# Patient Record
Sex: Female | Born: 2000 | Race: White | Hispanic: No | Marital: Single | Smoking: Never smoker
Health system: Southern US, Community
[De-identification: ages and names within clinical notes are randomized; demographics above are authoritative.]

## PROBLEM LIST (undated history)

## (undated) DIAGNOSIS — J45909 Unspecified asthma, uncomplicated: Secondary | ICD-10-CM

## (undated) DIAGNOSIS — F32A Depression, unspecified: Secondary | ICD-10-CM

## (undated) DIAGNOSIS — F419 Anxiety disorder, unspecified: Secondary | ICD-10-CM

## (undated) HISTORY — DX: Depression, unspecified: F32.A

## (undated) HISTORY — DX: Anxiety disorder, unspecified: F41.9

## (undated) HISTORY — DX: Unspecified asthma, uncomplicated: J45.909

---
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
## 2022-05-10 ENCOUNTER — Other Ambulatory Visit: Payer: Self-pay

## 2022-05-10 ENCOUNTER — Encounter (HOSPITAL_COMMUNITY): Payer: Self-pay | Admitting: Emergency Medicine

## 2022-05-10 ENCOUNTER — Emergency Department (HOSPITAL_COMMUNITY)
Admission: EM | Admit: 2022-05-10 | Discharge: 2022-05-10 | Discharge status: Against Medical Advice | Payer: Medicaid Other | Attending: Emergency Medicine | Admitting: Emergency Medicine

## 2022-05-10 DIAGNOSIS — Z5321 Procedure and treatment not carried out due to patient leaving prior to being seen by health care provider: Secondary | ICD-10-CM | POA: Diagnosis not present

## 2022-05-10 DIAGNOSIS — M79601 Pain in right arm: Secondary | ICD-10-CM | POA: Insufficient documentation

## 2022-05-10 DIAGNOSIS — Y92410 Unspecified street and highway as the place of occurrence of the external cause: Secondary | ICD-10-CM | POA: Diagnosis not present

## 2022-05-10 DIAGNOSIS — M25571 Pain in right ankle and joints of right foot: Secondary | ICD-10-CM | POA: Insufficient documentation
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
## 2022-05-10 NOTE — ED Notes (Signed)
Pt left with visitors due to significant other being transported to [place] [facility] via [facility]. Pt thought he had been transported here. Pt seen walking out with visitors.

## 2022-05-11 ENCOUNTER — Emergency Department: Payer: Medicaid Other

## 2022-05-11 ENCOUNTER — Emergency Department
Admission: EM | Admit: 2022-05-11 | Discharge: 2022-05-11 | Disposition: A | Discharge status: Home / Self Care | Payer: Medicaid Other | Attending: Emergency Medicine | Admitting: Emergency Medicine

## 2022-05-11 ENCOUNTER — Other Ambulatory Visit: Payer: Self-pay

## 2022-05-11 DIAGNOSIS — R0789 Other chest pain: Secondary | ICD-10-CM | POA: Insufficient documentation

## 2022-05-11 DIAGNOSIS — M79601 Pain in right arm: Secondary | ICD-10-CM | POA: Diagnosis present

## 2022-05-11 DIAGNOSIS — Z789 Other specified health status: Secondary | ICD-10-CM

## 2022-05-11 LAB — CBC
HCT: 43.8 % (ref 36.0–46.0)
Hemoglobin: 14.3 g/dL (ref 12.0–15.0)
MCH: 27.3 pg (ref 26.0–34.0)
MCHC: 32.6 g/dL (ref 30.0–36.0)
MCV: 83.7 fL (ref 80.0–100.0)
Platelets: 285 10*3/uL (ref 150–400)
RBC: 5.23 MIL/uL — ABNORMAL HIGH (ref 3.87–5.11)
RDW: 12.3 % (ref 11.5–15.5)
WBC: 10.8 10*3/uL — ABNORMAL HIGH (ref 4.0–10.5)
nRBC: 0 % (ref 0.0–0.2)

## 2022-05-11 LAB — COMPREHENSIVE METABOLIC PANEL
ALT: 12 U/L (ref 0–44)
AST: 20 U/L (ref 15–41)
Albumin: 4.9 g/dL (ref 3.5–5.0)
Alkaline Phosphatase: 52 U/L (ref 38–126)
Anion gap: 7 (ref 5–15)
BUN: 15 mg/dL (ref 6–20)
CO2: 24 mmol/L (ref 22–32)
Calcium: 9.5 mg/dL (ref 8.9–10.3)
Chloride: 103 mmol/L (ref 98–111)
Creatinine, Ser: 0.74 mg/dL (ref 0.44–1.00)
GFR, Estimated: >60 mL/min (ref >60)
Glucose, Bld: 98 mg/dL (ref 70–99)
Potassium: 3.5 mmol/L (ref 3.5–5.1)
Sodium: 134 mmol/L — ABNORMAL LOW (ref 135–145)
Total Bilirubin: 0.7 mg/dL (ref 0.3–1.2)
Total Protein: 8.8 g/dL — ABNORMAL HIGH (ref 6.5–8.1)

## 2022-05-11 LAB — POC URINE PREG, ED: Preg Test, Ur: NEGATIVE

## 2022-05-11 MED ORDER — IOHEXOL 300 MG/ML  SOLN
80.0000 mL | Freq: Once | INTRAMUSCULAR | Status: AC | PRN
Start: 2022-05-11 — End: 2022-05-11
  Administered 2022-05-11: 100 mL via INTRAVENOUS
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
## 2022-05-11 NOTE — ED Notes (Signed)
Pt in xray

## 2022-05-11 NOTE — ED Notes (Signed)
Patient transported to CT 

## 2022-05-11 NOTE — Discharge Instructions (Signed)
Please take Tylenol and ibuprofen/Advil for your pain.  It is safe to take them together, or to alternate them every few hours.  Take up to 1000mg of Tylenol at a time, up to 4 times per day.  Do not take more than 4000 mg of Tylenol in 24 hours.  For ibuprofen, take 400-600 mg, 3 - 4 times per day.  

## 2022-05-12 ENCOUNTER — Ambulatory Visit: Payer: Medicaid Other | Admitting: Dermatology

## 2022-09-03 ENCOUNTER — Encounter: Payer: Self-pay | Admitting: Emergency Medicine

## 2022-09-03 ENCOUNTER — Emergency Department: Payer: Medicaid Other

## 2022-09-03 ENCOUNTER — Other Ambulatory Visit: Payer: Self-pay

## 2022-09-03 DIAGNOSIS — J101 Influenza due to other identified influenza virus with other respiratory manifestations: Secondary | ICD-10-CM | POA: Diagnosis not present

## 2022-09-03 DIAGNOSIS — Z20822 Contact with and (suspected) exposure to covid-19: Secondary | ICD-10-CM | POA: Insufficient documentation

## 2022-09-03 DIAGNOSIS — R059 Cough, unspecified: Secondary | ICD-10-CM | POA: Diagnosis present

## 2022-09-03 NOTE — ED Triage Notes (Signed)
Patient ambulatory to triage with steady gait, without difficulty or distress noted; pt reports several days of fever, prod cough yellow sputum; home covid test neg

## 2022-09-04 ENCOUNTER — Emergency Department
Admission: EM | Admit: 2022-09-04 | Discharge: 2022-09-04 | Disposition: A | Discharge status: Home / Self Care | Payer: Medicaid Other | Attending: Emergency Medicine | Admitting: Emergency Medicine

## 2022-09-04 DIAGNOSIS — J101 Influenza due to other identified influenza virus with other respiratory manifestations: Secondary | ICD-10-CM

## 2022-09-04 LAB — RESP PANEL BY RT-PCR (RSV, FLU A&B, COVID)  RVPGX2
Influenza A by PCR: POSITIVE — AB
Influenza B by PCR: NEGATIVE
Resp Syncytial Virus by PCR: NEGATIVE
SARS Coronavirus 2 by RT PCR: NEGATIVE

## 2022-09-04 MED ORDER — IBUPROFEN 800 MG PO TABS
800.0000 mg | ORAL_TABLET | Freq: Once | ORAL | Status: AC
  Administered 2022-09-04: 800 mg via ORAL
  Filled 2022-09-04: qty 1

## 2022-09-04 MED ORDER — BENZONATATE 100 MG PO CAPS
100.0000 mg | ORAL_CAPSULE | Freq: Once | ORAL | Status: AC
  Administered 2022-09-04: 100 mg via ORAL
  Filled 2022-09-04: qty 1

## 2022-09-04 MED ORDER — BENZONATATE 100 MG PO CAPS: 100.0000 mg | ORAL_CAPSULE | Freq: Three times a day (TID) | ORAL | 0 refills | Status: AC | PRN

## 2022-09-04 NOTE — Discharge Instructions (Addendum)
You may alternate Tylenol 1000 mg every 6 hours as needed for fever and pain and ibuprofen 800 mg every 6-8 hours as needed for fever and pain. Please rest and drink plenty of fluids. This is a viral illness causing your symptoms. You do not need antibiotics for a virus. You may use over-the-counter nasal saline spray and Afrin nasal saline spray as needed for nasal congestion. Please do not use Afrin for more than 3 days in a row. You may use guaifenesin and dextromethorphan as needed for cough.  You may use lozenges and Chloraseptic spray to help with sore throat.  Warm salt water gargles can also help with sore throat.  You may use over-the-counter Unisom (doxyalamine) or Benadryl (diphenhydramine) to help with sleep.  Please note that some combination medicines such as DayQuil and NyQuil have multiple medications in them.  Please make sure you look at all labels to ensure that you are not taking too much of any one particular medication.  Symptoms from a virus may take 7-14 days to run its course.  The flu is treated like any other virus with supportive measures as listed above. At this time you are outside the treatment window for Tamiflu or Xofluza. These medications have to be taken within the first 48 hours of symptoms.  Tamiflu has many side effects including nausea, vomiting and diarrhea.  

## 2022-09-04 NOTE — ED Provider Notes (Signed)
[facility] Provider Note    Event Date/Time   First MD Initiated Contact with Patient 09/04/22 [phone]     (approximate)   History   Cough   HPI  Alexandra Larson is a 21 y.o. female with no significant past medical history who presents to the emergency department with cough, congestion, fever, body aches for the past 5 to 6 days.  No vomiting or diarrhea.   History provided by patient and significant other.    History reviewed. No pertinent past medical history.  History reviewed. No pertinent surgical history.  MEDICATIONS:  Prior to Admission medications   Medication Sig Start Date End Date Taking? Authorizing Provider  benzonatate (TESSALON PERLES) 100 MG capsule Take 1 capsule (100 mg total) by mouth 3 (three) times daily as needed for cough. 09/04/22 09/04/23 Yes Quincie Haroon, Layla Maw, DO  etonogestrel (NEXPLANON) 68 MG IMPL implant 1 each by Subdermal route once.    [provider]  ibuprofen (ADVIL) 600 MG tablet Take 1 tablet (600 mg total) by mouth every 8 (eight) hours as needed for moderate pain. 08/07/21   Orvil Feil, PA-C    Physical Exam   Triage Vital Signs: ED Triage Vitals  Enc Vitals Group     BP 09/03/22 2357 134/77     Pulse Rate 09/03/22 2357 81     Resp 09/03/22 2357 17     Temp 09/03/22 2357 98.4 F (36.9 C)     Temp Source 09/03/22 2357 Oral     SpO2 09/03/22 2357 98 %     Weight 09/03/22 2351 90 lb (40.8 kg)     Height 09/03/22 2351 5\' 2"  (1.575 m)     Head Circumference --      Peak Flow --      Pain Score 09/03/22 2351 0     Pain Loc --      Pain Edu? --      Excl. in GC? --     Most recent vital signs: Vitals:   09/03/22 2357 09/04/22 0435  BP: 134/77 129/72  Pulse: 81 75  Resp: 17 16  Temp: 98.4 F (36.9 C)   SpO2: 98% 99%    CONSTITUTIONAL: Alert and oriented and responds appropriately to questions. Well-appearing; well-nourished HEAD: Normocephalic, atraumatic EYES: Conjunctivae clear,  pupils appear equal, sclera nonicteric ENT: normal nose; moist mucous membranes NECK: Supple, normal ROM CARD: RRR; S1 and S2 appreciated; no murmurs, no clicks, no rubs, no gallops RESP: Normal chest excursion without splinting or tachypnea; breath sounds clear and equal bilaterally; no wheezes, no rhonchi, no rales, no hypoxia or respiratory distress, speaking full sentences ABD/GI: Normal bowel sounds; non-distended; soft, non-tender, no rebound, no guarding, no peritoneal signs BACK: The back appears normal EXT: Normal ROM in all joints; no deformity noted, no edema; no cyanosis SKIN: Normal color for age and race; warm; no rash on exposed skin NEURO: Moves all extremities equally, normal speech PSYCH: The patient's mood and manner are appropriate.   ED Results / Procedures / Treatments   LABS: (all labs ordered are listed, but only abnormal results are displayed) Labs Reviewed  RESP PANEL BY RT-PCR (RSV, FLU A&B, COVID)  [id number] - Abnormal; Notable for the following components:      Result Value   Influenza A by PCR POSITIVE (*)    All other components within normal limits     EKG:  RADIOLOGY: My personal review and interpretation of imaging: Chest x-ray clear.  I have personally reviewed all radiology reports.   DG Chest 2 View  Result Date: 09/04/2022 CLINICAL DATA:  Cough EXAM: CHEST - 2 VIEW COMPARISON:  02/12/2018 FINDINGS: The heart size and mediastinal contours are within normal limits. Both lungs are clear. The visualized skeletal structures are unremarkable. IMPRESSION: No active cardiopulmonary disease. Electronically Signed   By: Helyn Numbers M.D.   On: 09/04/2022 00:09     PROCEDURES:  Critical Care performed: No     Procedures    IMPRESSION / MDM / ASSESSMENT AND PLAN / ED COURSE  I reviewed the triage vital signs and the nursing notes.    Patient here with flulike symptoms.    DIFFERENTIAL DIAGNOSIS (includes but not limited to):   Viral  URI, pneumonia, doubt sepsis, meningitis   Patient's presentation is most consistent with acute complicated illness / injury requiring diagnostic workup.   PLAN: COVID, flu and RSV swabs obtained from triage.  Patient is positive for influenza A.  Chest x-ray reviewed and interpreted by myself and the radiologist and shows no acute abnormality.  Patient well-appearing.  Will give ibuprofen, Tessalon Perles for symptomatic relief.  Will discharge with prescription for Tessalon Perles.  Discussed supportive care instructions and return precautions.  She is outside treatment window for antiviral medications.   MEDICATIONS GIVEN IN ED: Medications  ibuprofen (ADVIL) tablet 800 mg (800 mg Oral Given 09/04/22 0434)  benzonatate (TESSALON) capsule 100 mg (100 mg Oral Given 09/04/22 0434)     ED COURSE:  At this time, I do not feel there is any life-threatening condition present. I reviewed all nursing notes, vitals, pertinent previous records.  All lab and urine results, EKGs, imaging ordered have been independently reviewed and interpreted by myself.  I reviewed all available radiology reports from any imaging ordered this visit.  Based on my assessment, I feel the patient is safe to be discharged home without further emergent workup and can continue workup as an outpatient as needed. Discussed all findings, treatment plan as well as usual and customary return precautions.  They verbalize understanding and are comfortable with this plan.  Outpatient follow-up has been provided as needed.  All questions have been answered.    CONSULTS:  none   OUTSIDE RECORDS REVIEWED: Reviewed last internal medicine note with Marvis Moeller on 06/06/2020.       FINAL CLINICAL IMPRESSION(S) / ED DIAGNOSES   Final diagnoses:  Influenza A     Rx / DC Orders   ED Discharge Orders          Ordered    benzonatate (TESSALON PERLES) 100 MG capsule  3 times daily PRN        09/04/22 0420              Note:  This document was prepared using Dragon voice recognition software and may include unintentional dictation errors.   Iolanda Folson, Layla Maw, DO 09/04/22 [phone]

## 2023-02-03 IMAGING — US US RENAL
1 series · 14 of 25 positions shown · non-contrast
Comparison: None.

CLINICAL DATA: Hematuria.

EXAM:
RENAL / URINARY TRACT ULTRASOUND COMPLETE

[Series 1: us renal · 14 of 57 slices shown]
[im 1/57]
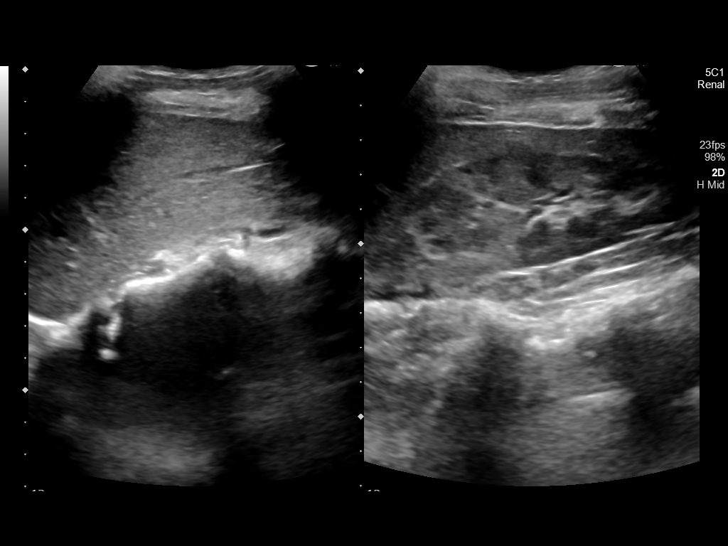
[im 5/57]
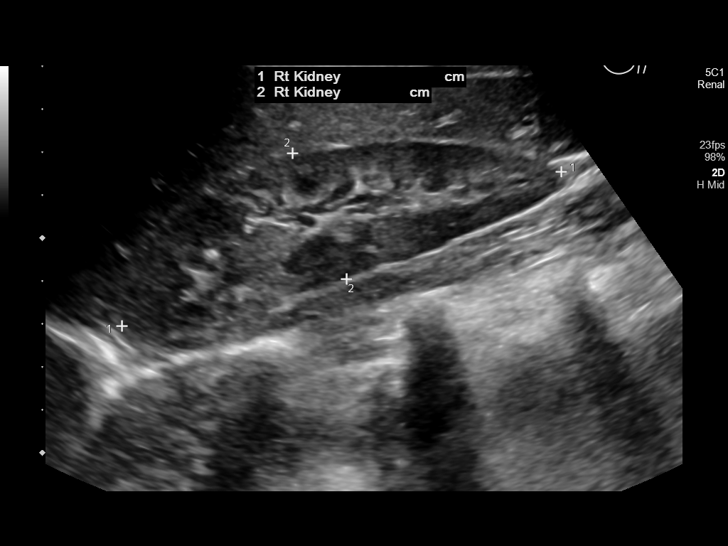
[im 10/57]
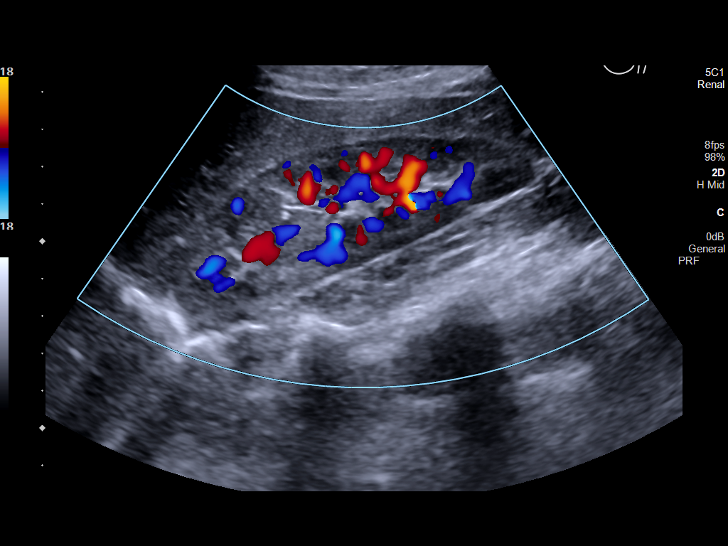
[im 15/57]
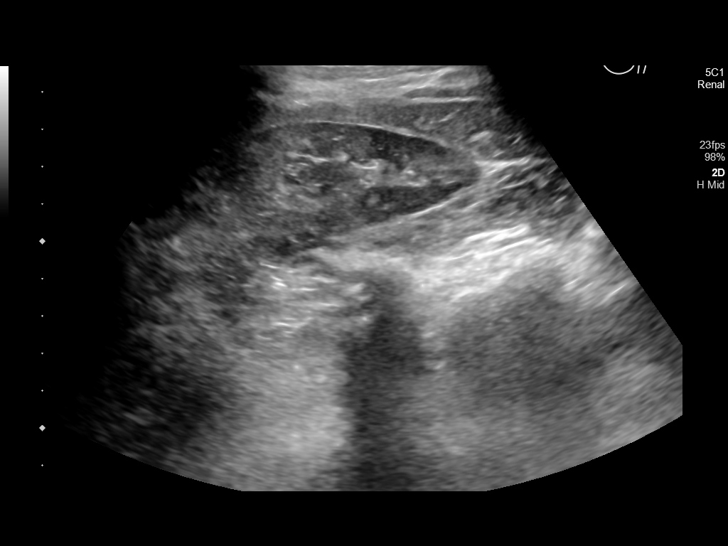
[im 19/57]
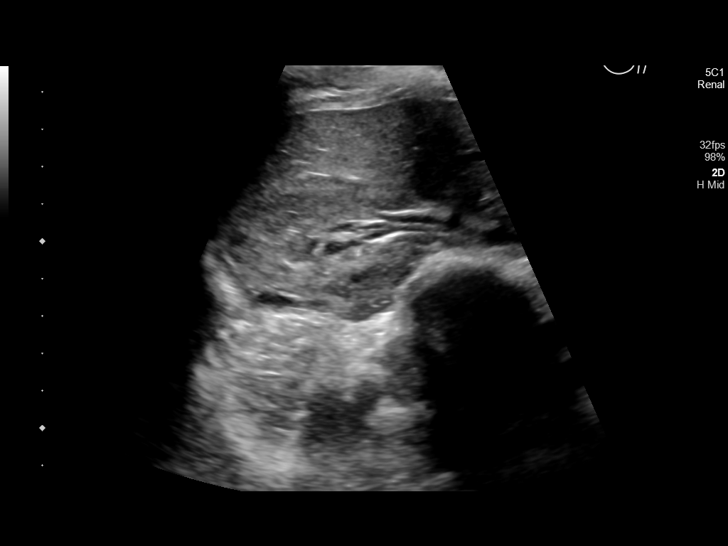
[im 22/57]
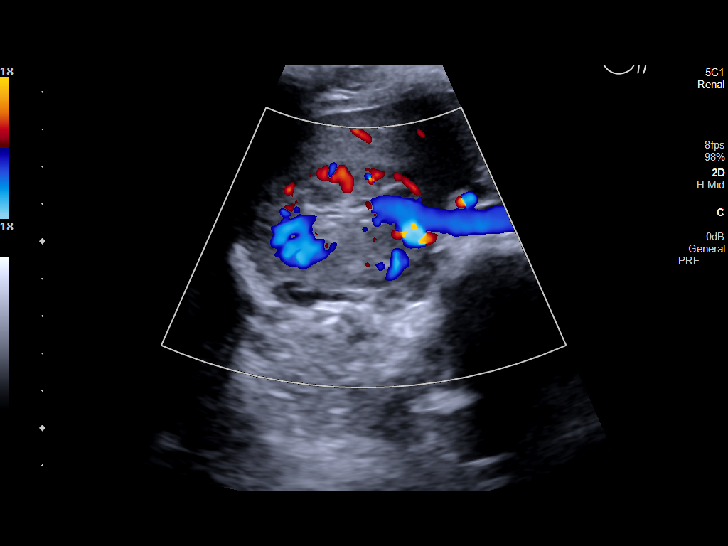
[im 26/57]
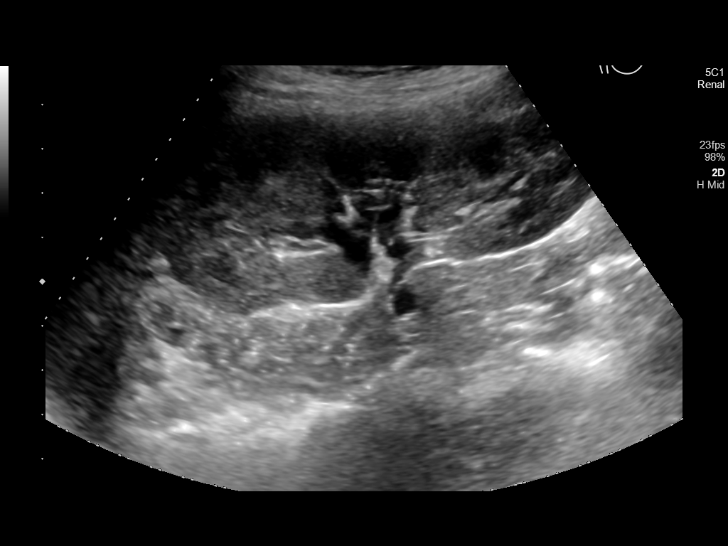
[im 31/57]
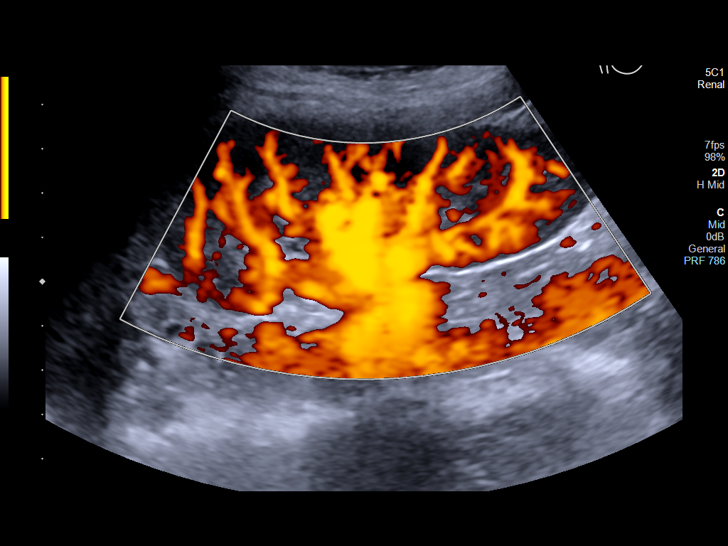
[im 36/57]
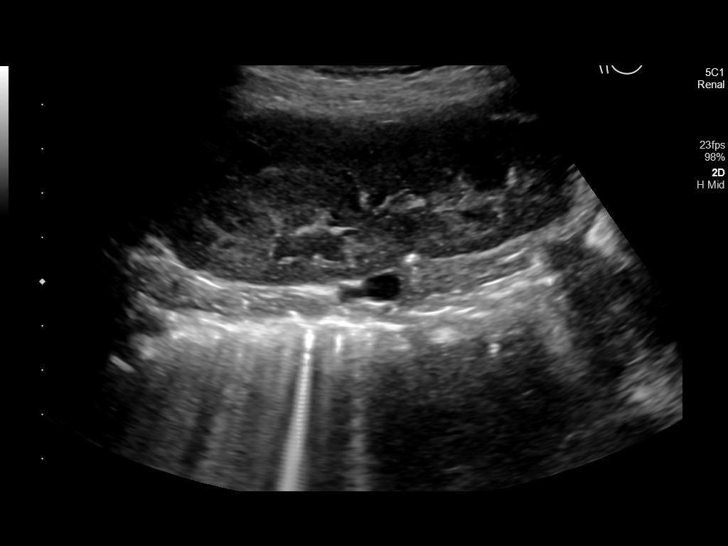
[im 38/57]
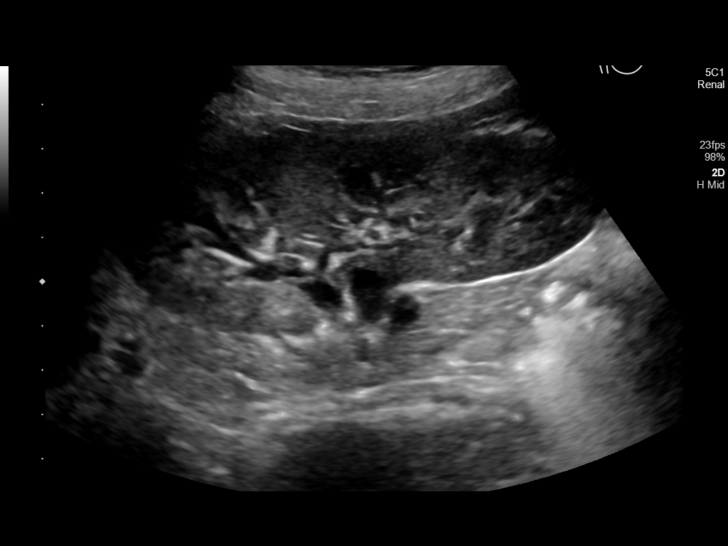
[im 43/57]
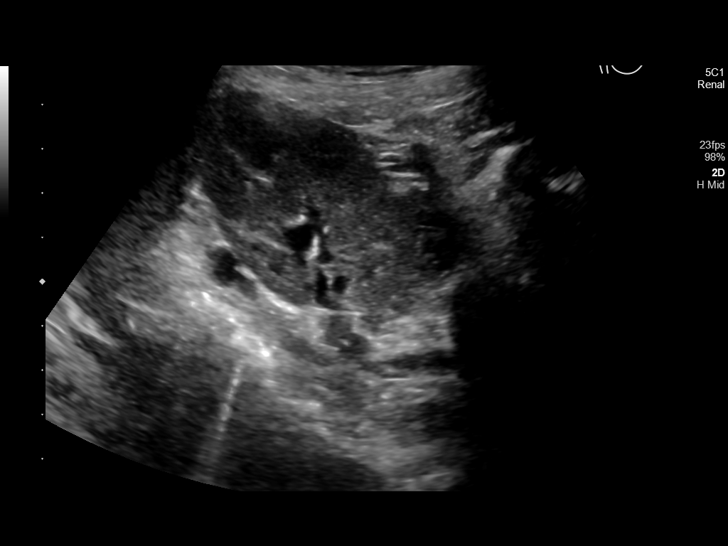
[im 47/57]
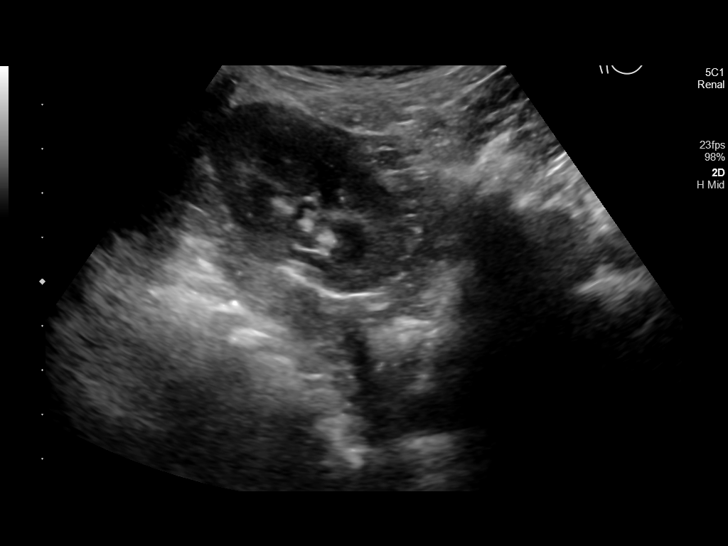
[im 52/57]
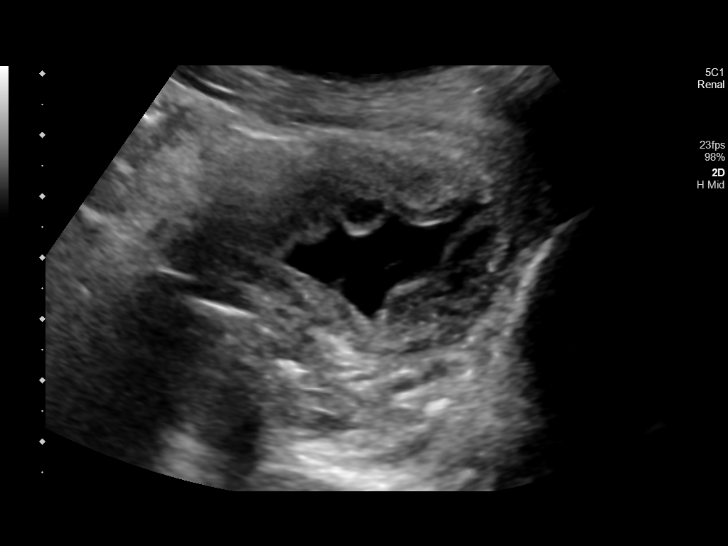
[im 57/57]
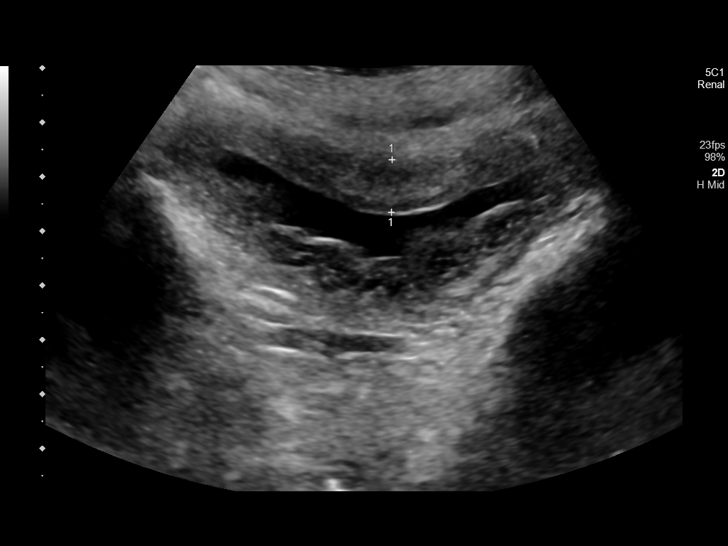

[14 of 25 positions shown; findings below may reference images not displayed]

FINDINGS: Right Kidney:

Renal measurements: 10.8 x 3.2 x 4.4 cm = volume: 79 mL.
Echogenicity within normal limits. No mass or hydronephrosis
visualized.

Left Kidney:

Renal measurements: 10.8 x 4.0 x 3.5 cm = volume: 80 mL.
Echogenicity within normal limits. No mass or hydronephrosis
visualized.

Bladder:

Bladder is mostly decompressed. Despite being decompressed, wall
still appears thickened. No convincing bladder mass or stone.

Other:

None.
IMPRESSION: 1. Normal kidneys.
2. Bladder wall appears thickened, with bladder assessment limited
by lack of distension. Findings support cystitis in the proper
clinical setting.

## 2023-04-19 ENCOUNTER — Other Ambulatory Visit: Payer: Self-pay

## 2023-04-19 ENCOUNTER — Emergency Department: Payer: Medicaid Other

## 2023-04-19 ENCOUNTER — Emergency Department: Admission: EM | Admit: 2023-04-19 | Payer: Medicaid Other | Source: Home / Self Care

## 2023-04-19 DIAGNOSIS — R079 Chest pain, unspecified: Secondary | ICD-10-CM | POA: Diagnosis present

## 2023-04-19 DIAGNOSIS — R1010 Upper abdominal pain, unspecified: Secondary | ICD-10-CM | POA: Insufficient documentation

## 2023-04-19 DIAGNOSIS — R0789 Other chest pain: Secondary | ICD-10-CM | POA: Diagnosis not present

## 2023-04-19 MED ORDER — PANTOPRAZOLE SODIUM 40 MG PO TBEC
40.0000 mg | DELAYED_RELEASE_TABLET | Freq: Once | ORAL | Status: AC
  Administered 2023-04-20: 40 mg via ORAL
  Filled 2023-04-19: qty 1

## 2023-04-19 MED ORDER — ALUM & MAG HYDROXIDE-SIMETH 200-200-20 MG/5ML PO SUSP
30.0000 mL | Freq: Once | ORAL | Status: AC
  Administered 2023-04-20: 30 mL via ORAL
  Filled 2023-04-19: qty 30

## 2023-04-19 NOTE — ED Provider Notes (Signed)
[facility] Provider Note    Event Date/Time   First MD Initiated Contact with Patient 04/19/23 2312     (approximate)   History   Chest Pain and Back Pain   HPI  Alexandra Larson is a 22 y.o. female with no significant past medical history who presents to the emergency department with complaints of lower chest and upper abdominal pain ongoing for several days.  Symptoms worse after eating.  No alleviating factors.  States it feels like a pressure and sometimes she does feel short of breath with it.  No nausea, vomiting, diarrhea, bloody stools, melena, dysuria or hematuria.  Still has her gallbladder.  No history of gastric ulcers.  No history of PE, DVT, exogenous estrogen use, recent fractures, surgery, trauma, hospitalization, prolonged travel or other immobilization. No lower extremity swelling or pain. No calf tenderness.  History provided by patient, significant other.    History reviewed. No pertinent past medical history.  History reviewed. No pertinent surgical history.  MEDICATIONS:  Prior to Admission medications   Medication Sig Start Date End Date Taking? Authorizing Provider  benzonatate (TESSALON PERLES) 100 MG capsule Take 1 capsule (100 mg total) by mouth 3 (three) times daily as needed for cough. 09/04/22 09/04/23  Antario Yasuda, Layla Maw, DO  etonogestrel (NEXPLANON) 68 MG IMPL implant 1 each by Subdermal route once.    [provider]  ibuprofen (ADVIL) 600 MG tablet Take 1 tablet (600 mg total) by mouth every 8 (eight) hours as needed for moderate pain. 08/07/21   Orvil Feil, PA-C    Physical Exam   Triage Vital Signs: ED Triage Vitals  Encounter Vitals Group     BP 04/19/23 2306 110/79     Systolic BP Percentile --      Diastolic BP Percentile --      Pulse Rate 04/19/23 2306 66     Resp 04/19/23 2306 19     Temp 04/19/23 2306 98.3 F (36.8 C)     Temp Source 04/19/23 2306 Oral     SpO2 04/19/23 2306 100 %     Weight  04/19/23 2307 90 lb (40.8 kg)     Height 04/19/23 2307 5\' 2"  (1.575 m)     Head Circumference --      Peak Flow --      Pain Score 04/19/23 2307 8     Pain Loc --      Pain Education --      Exclude from Growth Chart --     Most recent vital signs: Vitals:   04/19/23 2306  BP: 110/79  Pulse: 66  Resp: 19  Temp: 98.3 F (36.8 C)  SpO2: 100%    CONSTITUTIONAL: Alert, responds appropriately to questions. Well-appearing; well-nourished HEAD: Normocephalic, atraumatic EYES: Conjunctivae clear, pupils appear equal, sclera nonicteric ENT: normal nose; moist mucous membranes NECK: Supple, normal ROM CARD: RRR; S1 and S2 appreciated RESP: Normal chest excursion without splinting or tachypnea; breath sounds clear and equal bilaterally; no wheezes, no rhonchi, no rales, no hypoxia or respiratory distress, speaking full sentences ABD/GI: Non-distended; soft, tender in the upper abdomen without guarding, rebound, negative Murphy sign BACK: The back appears normal EXT: Normal ROM in all joints; no deformity noted, no edema, no calf tenderness or calf swelling SKIN: Normal color for age and race; warm; no rash on exposed skin NEURO: Moves all extremities equally, normal speech PSYCH: The patient's mood and manner are appropriate.   ED Results / Procedures /  Treatments   LABS: (all labs ordered are listed, but only abnormal results are displayed) Labs Reviewed  COMPREHENSIVE METABOLIC PANEL - Abnormal; Notable for the following components:      Result Value   Sodium 133 (*)    Potassium 3.2 (*)    Glucose, Bld 106 (*)    All other components within normal limits  CBC  LIPASE, BLOOD  POC URINE PREG, ED  TROPONIN I (HIGH SENSITIVITY)     EKG:  EKG Interpretation Date/Time:  Monday April 19 2023 23:10:48 EDT Ventricular Rate:  60 PR Interval:  128 QRS Duration:  82 QT Interval:  392 QTC Calculation: 392 R Axis:   77  Text Interpretation: Normal sinus rhythm Normal ECG  When compared with ECG of 12-Feb-2018 13:31, PREVIOUS ECG IS PRESENT Confirmed by Rochele Raring [phone]) on 04/19/2023 11:12:59 PM         RADIOLOGY: My personal review and interpretation of imaging: Chest x-ray clear.  Ultrasound shows no acute abnormality.  I have personally reviewed all radiology reports.   US ABDOMEN LIMITED RUQ (LIVER/GB)  Result Date: 04/20/2023 CLINICAL DATA:  Upper abdominal pain x2 days EXAM: ULTRASOUND ABDOMEN LIMITED RIGHT UPPER QUADRANT COMPARISON:  None Available. FINDINGS: Gallbladder: Contracted gallbladder. No gallstones, gallbladder wall thickening, or pericholecystic fluid. Negative sonographic Murphy's sign. Common bile duct: Diameter: 5 mm Liver: Within the upper limits of normal for parenchymal echogenicity. No focal hepatic lesion is seen. Portal vein is patent on color Doppler imaging with normal direction of blood flow towards the liver. Other: None. IMPRESSION: Negative right upper quadrant ultrasound. Electronically Signed   By: Charline Bills M.D.   On: 04/20/2023 00:49   DG Chest 2 View  Result Date: 04/19/2023 CLINICAL DATA:  Chest pain EXAM: CHEST - 2 VIEW COMPARISON:  09/03/2022 FINDINGS: Lungs are clear.  No pleural effusion or pneumothorax. The heart is normal in size. Visualized osseous structures are within normal limits. IMPRESSION: Normal chest radiographs. Electronically Signed   By: Charline Bills M.D.   On: 04/19/2023 23:40     PROCEDURES:  Critical Care performed: No    .1-3 Lead EKG Interpretation  Performed by: Jermani Eberlein, Layla Maw, DO Authorized by: Yona Stansbury, Layla Maw, DO     Interpretation: normal     ECG rate:  66   ECG rate assessment: normal     Rhythm: sinus rhythm     Ectopy: none     Conduction: normal       IMPRESSION / MDM / ASSESSMENT AND PLAN / ED COURSE  I reviewed the triage vital signs and the nursing notes.    Patient here with lower chest, upper abdominal pain worse after eating.  The patient is on  the cardiac monitor to evaluate for evidence of arrhythmia and/or significant heart rate changes.   DIFFERENTIAL DIAGNOSIS (includes but not limited to):   Gastritis, H. pylori, peptic ulcer, duodenitis, cholelithiasis, less likely cholecystitis or cholangitis, no symptoms of GI bleed.  Low suspicion for ACS.  Doubt PE as she is PERC negative.  Doubt dissection.   Patient's presentation is most consistent with acute illness / injury with system symptoms.   PLAN: Will obtain abdominal labs, chest x-ray, right upper quadrant ultrasound.  EKG is nonischemic.  Will give Mylanta, Protonix for symptomatic relief and reassess.   MEDICATIONS GIVEN IN ED: Medications  sucralfate (CARAFATE) tablet 1 g (has no administration in time range)  lidocaine (XYLOCAINE) 2 % viscous mouth solution 15 mL (has no administration in time  range)  pantoprazole (PROTONIX) EC tablet 40 mg (40 mg Oral Given 04/20/23 0003)  alum & mag hydroxide-simeth (MAALOX/MYLANTA) 200-200-20 MG/5ML suspension 30 mL (30 mLs Oral Given 04/20/23 0003)     ED COURSE: Labs show no leukocytosis, normal hemoglobin, normal LFTs and lipase.  Troponin negative.  I do not feel she needs serial enzymes given low risk for ACS and symptoms ongoing for several days.  Chest x-ray and right upper quadrant ultrasound reviewed and interpreted by myself and the radiologist and are unremarkable.  Discussed with patient that this is likely GI in nature given symptoms change with eating.  Recommended follow-up with gastroenterology.  Recommended bland diet, avoiding alcohol and NSAIDs.  Will discharge on Protonix and Carafate.   At this time, I do not feel there is any life-threatening condition present. I reviewed all nursing notes, vitals, pertinent previous records.  All lab and urine results, EKGs, imaging ordered have been independently reviewed and interpreted by myself.  I reviewed all available radiology reports from any imaging ordered this visit.   Based on my assessment, I feel the patient is safe to be discharged home without further emergent workup and can continue workup as an outpatient as needed. Discussed all findings, treatment plan as well as usual and customary return precautions.  They verbalize understanding and are comfortable with this plan.  Outpatient follow-up has been provided as needed.  All questions have been answered.    CONSULTS:  none   OUTSIDE RECORDS REVIEWED: Reviewed previous cardiology note at [facility] in 2019 for chest pain.  Patient had a normal echocardiogram at that time.       FINAL CLINICAL IMPRESSION(S) / ED DIAGNOSES   Final diagnoses:  Atypical chest pain     Rx / DC Orders   ED Discharge Orders          Ordered    pantoprazole (PROTONIX) 40 MG tablet  Daily        04/20/23 0113    sucralfate (CARAFATE) 1 g tablet  4 times daily        04/20/23 0113             Note:  This document was prepared using Dragon voice recognition software and may include unintentional dictation errors.   Ocean Schildt, Layla Maw, DO 04/20/23 [phone]

## 2023-04-19 NOTE — ED Triage Notes (Signed)
Pt to ED via POV c/o central chest pain and back pain that woke her out of her sleep a couple weeks ago. Pain comes and goes. This episode of pain started a couple days ago and has not stopped. Denies N/V, fevers, SOB, dizziness.

## 2023-04-20 ENCOUNTER — Emergency Department: Payer: Medicaid Other

## 2023-04-20 LAB — CBC
HCT: 38.4 % (ref 36.0–46.0)
Hemoglobin: 12.7 g/dL (ref 12.0–15.0)
MCH: 28 pg (ref 26.0–34.0)
MCHC: 33.1 g/dL (ref 30.0–36.0)
MCV: 84.6 fL (ref 80.0–100.0)
Platelets: 195 10*3/uL (ref 150–400)
RBC: 4.54 MIL/uL (ref 3.87–5.11)
RDW: 11.9 % (ref 11.5–15.5)
WBC: 8.8 10*3/uL (ref 4.0–10.5)
nRBC: 0 % (ref 0.0–0.2)

## 2023-04-20 MED ORDER — PANTOPRAZOLE SODIUM 40 MG PO TBEC
40.0000 mg | DELAYED_RELEASE_TABLET | Freq: Every day | ORAL | 1 refills | Status: DC
Start: 2023-04-20 — End: 2024-05-01

## 2023-04-20 MED ORDER — LIDOCAINE VISCOUS HCL 2 % MT SOLN
15.0000 mL | Freq: Once | OROMUCOSAL | Status: AC
  Administered 2023-04-20: 15 mL via OROMUCOSAL
  Filled 2023-04-20: qty 15

## 2023-04-20 MED ORDER — SUCRALFATE 1 G PO TABS: 1.0000 g | ORAL_TABLET | Freq: Four times a day (QID) | ORAL | 1 refills | Status: DC

## 2023-04-20 MED ORDER — SUCRALFATE 1 G PO TABS
1.0000 g | ORAL_TABLET | Freq: Once | ORAL | Status: AC
  Administered 2023-04-20: 1 g via ORAL
  Filled 2023-04-20: qty 1

## 2023-04-20 NOTE — Discharge Instructions (Addendum)
Your cardiac labs, abdominal labs, chest x-ray, EKG and abdominal ultrasound were normal and reassuring today.  I recommend close follow-up with your primary care doctor if symptoms continue as well as gastroenterology.  Please return to the emergency department if you have worsening pain, vomiting that will not stop, fever of 100.4 or higher, blood in your stool or black and tarry stools.  Please avoid NSAIDs such as aspirin (Goody powders), ibuprofen (Motrin, Advil), naproxen (Aleve) as these may worsen your symptoms.  Tylenol 1000 mg every 6 hours is safe to take as long as you have no history of liver problems (heavy alcohol use, cirrhosis, hepatitis).  Please avoid spicy, acidic (citrus fruits, tomato based sauces, salsa), greasy, fatty foods.  Please avoid caffeine and alcohol.  Smoking can also make GERD/acid reflux worse.  Over the counter medications such as TUMS, Maalox or Mylanta, pepcid.

## 2024-01-12 ENCOUNTER — Ambulatory Visit (INDEPENDENT_AMBULATORY_CARE_PROVIDER_SITE_OTHER): Admitting: Physician Assistant

## 2024-01-12 ENCOUNTER — Encounter: Payer: Self-pay | Admitting: Physician Assistant

## 2024-01-12 VITALS — BP 107/81 | HR 71 | Ht 62.0 in | Wt 92.6 lb

## 2024-01-12 DIAGNOSIS — Z3009 Encounter for other general counseling and advice on contraception: Secondary | ICD-10-CM | POA: Diagnosis not present

## 2024-01-12 DIAGNOSIS — Z7689 Persons encountering health services in other specified circumstances: Secondary | ICD-10-CM | POA: Diagnosis not present

## 2024-01-12 DIAGNOSIS — F419 Anxiety disorder, unspecified: Secondary | ICD-10-CM | POA: Insufficient documentation

## 2024-01-12 MED ORDER — ESCITALOPRAM OXALATE 10 MG PO TABS: 10.0000 mg | ORAL_TABLET | Freq: Every day | ORAL | 1 refills | Status: DC

## 2024-01-12 NOTE — Progress Notes (Signed)
 New patient visit  Patient: Alexandra Larson   DOB: [birth year]   23 y.o. Female  MRN: [id number] Visit Date: 01/12/2024  Today's healthcare provider: Blane Bunting, PA-C   Chief Complaint  Patient presents with   New Patient (Initial Visit)    Pt is present to establish care as well as she wants to discuss her medications and wanting to be sure she is able to have nexplanon removed here in office   Care Management    Chlamydia Screening - declined HPV Vaccines - declined HIV Screening - yes  Hepatitis C Screening - yes    Anxiety    Would like to see about different treatment options as she was previously given sertraline.    Contraception    Pt would like to have her nexplanon removed and reinserted by someone here in office if possible. Reports it was first inserted August 2022 at [facility] located in [place]. Pt reports she is sexually active    Subjective    Alexandra Larson is a 23 y.o. female who presents today as a new patient to establish care.   Discussed the use of AI scribe software for clinical note transcription with the patient, who gave verbal consent to proceed.  History of Present Illness Alexandra Larson is a 23 year old female who presents to establish care after transitioning from pediatric care.  She is in a stable relationship and has no current symptoms of concern, such as vaginal discharge. She declines hepatitis C and HIV screening at this time. She is considering the HPV vaccine and chlamydia screening for preventive health measures.     01/12/2024    2:35 PM  PHQ9 SCORE ONLY  PHQ-9 Total Score 6      01/12/2024    2:36 PM  GAD 7 : Generalized Anxiety Score  Nervous, Anxious, on Edge 3  Control/stop worrying 3  Worry too much - different things 3  Trouble relaxing 2  Restless 0  Easily annoyed or irritable 3  Afraid - awful might happen 2  Total GAD 7 Score 16  Anxiety Difficulty Very difficult      Past Medical History:  Diagnosis  Date   Anxiety    Asthma    Depression    History reviewed. No pertinent surgical history. No family status information on file.   History reviewed. No pertinent family history. Social History   Socioeconomic History   Marital status: Single    Spouse name: Not on file   Number of children: Not on file   Years of education: Not on file   Highest education level: 12th grade  Occupational History   Not on file  Tobacco Use   Smoking status: Never   Smokeless tobacco: Never  Vaping Use   Vaping status: Former   Substances: Nicotine, Flavoring  Substance and Sexual Activity   Alcohol use: Not Currently    Alcohol/week: 6.0 standard drinks of alcohol    Types: 4 Shots of liquor, 2 Standard drinks or equivalent per week   Drug use: Never   Sexual activity: Yes  Other Topics Concern   Not on file  Social History Narrative   Not on file   Social Drivers of Health   Financial Resource Strain: High Risk (01/05/2024)   Overall Financial Resource Strain (CARDIA)    Difficulty of Paying Living Expenses: Hard  Food Insecurity: Food Insecurity Present (01/05/2024)   Hunger Vital Sign    Worried About Radiation protection practitioner of Food  in the Last Year: Often true    Ran Out of Food in the Last Year: Often true  Transportation Needs: No Transportation Needs (01/05/2024)   PRAPARE - Administrator, Civil Service (Medical): No    Lack of Transportation (Non-Medical): No  Physical Activity: Insufficiently Active (01/05/2024)   Exercise Vital Sign    Days of Exercise per Week: 7 days    Minutes of Exercise per Session: 20 min  Stress: Stress Concern Present (01/05/2024)   [employer] of Occupational Health - Occupational Stress Questionnaire    Feeling of Stress : Very much  Social Connections: Moderately Isolated (01/05/2024)   Social Connection and Isolation Panel [NHANES]    Frequency of Communication with Friends and Family: More than three times a week    Frequency of Social  Gatherings with Friends and Family: Never    Attends Religious Services: Never    Database administrator or Organizations: No    Attends Engineer, structural: Not on file    Marital Status: Living with partner   Outpatient Medications Prior to Visit  Medication Sig   etonogestrel (NEXPLANON) 68 MG IMPL implant 1 each by Subdermal route once.   ibuprofen  (ADVIL ) 600 MG tablet Take 1 tablet (600 mg total) by mouth every 8 (eight) hours as needed for moderate pain.   pantoprazole  (PROTONIX ) 40 MG tablet Take 1 tablet (40 mg total) by mouth daily. (Patient not taking: Reported on 01/12/2024)   sucralfate  (CARAFATE ) 1 g tablet Take 1 tablet (1 g total) by mouth 4 (four) times daily. (Patient not taking: Reported on 01/12/2024)   No facility-administered medications prior to visit.   No Known Allergies  Immunization History  Administered Date(s) Administered   Dtap, Unspecified 03/23/2003, 06/20/2003, 09/20/2003, 07/01/2004, 03/10/2007   HIB, Unspecified 03/23/2003, 06/20/2003, 09/20/2003, 07/01/2004   Hep A, Unspecified 03/10/2007   Hep B, Unspecified 2003/07/13, 03/23/2003, 10/26/2003   Hepatitis A, Ped/Adol-2 Dose 02/02/2008   MMR 03/04/2004, 03/10/2007   Meningococcal B, OMV 02/17/2019, 01/20/2022   Meningococcal Mcv4o 01/22/2015, 02/17/2019   Pneumococcal Conjugate PCV 7 03/23/2003, 06/20/2003, 09/20/2003, 03/04/2004   Polio, Unspecified 03/23/2003, 06/20/2003, 10/26/2003, 03/10/2007   Tdap 01/22/2015   Varicella 03/04/2004, 03/10/2007    Health Maintenance  Topic Date Due   CHLAMYDIA SCREENING  Never done   HPV VACCINES (1 - 3-dose series) Never done   HIV Screening  Never done   Hepatitis C Screening  Never done   COVID-19 Vaccine (1 - 2024-25 season) Never done   INFLUENZA VACCINE  04/07/2024   DTaP/Tdap/Td (7 - Td or Tdap) 01/21/2025   Meningococcal B Vaccine  Completed    Patient Care Team: Adelyna Brockman, PA-C as PCP - General (Physician Assistant)  Review of  Systems  All other systems reviewed and are negative.  Except see HPI       Objective    BP 107/81 (BP Location: Left Arm, Patient Position: Sitting, Cuff Size: Normal)   Pulse 71   Ht 5\' 2"  (1.575 m)   Wt 92 lb 9.6 oz (42 kg)   LMP  (Within Weeks)   SpO2 100%   BMI 16.94 kg/m     Physical Exam Vitals reviewed.  Constitutional:      General: She is not in acute distress.    Appearance: Normal appearance. She is well-developed. She is not diaphoretic.  HENT:     Head: Normocephalic and atraumatic.  Eyes:     General: No scleral icterus.  Conjunctiva/sclera: Conjunctivae normal.  Neck:     Thyroid : No thyromegaly.  Cardiovascular:     Rate and Rhythm: Normal rate and regular rhythm.     Pulses: Normal pulses.     Heart sounds: Normal heart sounds. No murmur heard. Pulmonary:     Effort: Pulmonary effort is normal. No respiratory distress.     Breath sounds: Normal breath sounds. No wheezing, rhonchi or rales.  Musculoskeletal:     Cervical back: Neck supple.     Right lower leg: No edema.     Left lower leg: No edema.  Lymphadenopathy:     Cervical: No cervical adenopathy.  Skin:    General: Skin is warm and dry.     Findings: No rash.  Neurological:     Mental Status: She is alert and oriented to person, place, and time. Mental status is at baseline.  Psychiatric:        Mood and Affect: Mood normal.        Behavior: Behavior normal.     Depression Screen    01/12/2024    2:35 PM  PHQ 2/9 Scores  PHQ - 2 Score 2  PHQ- 9 Score 6   No results found for any visits on 01/12/24.  Assessment & Plan      Assessment & Plan Anxiety (Primary) Chronic, worsening GAD7 score 16 in the past use zoloft without any relief before d/c from pediatric services Discussed  - escitalopram (LEXAPRO) 10 MG tablet; Take 1 tablet (10 mg total) by mouth daily.  Dispense: 30 tablet; Refill: 1 - Ambulatory referral to Psychiatry - Ambulatory referral to  Psychology Relaxation/mindfulness techniques were recommended. Discussed that delayed treatment may result in poorer clinical outcomes compared with patients treated within 1 year of symptom onset. Remission may not occur until 4-6 months of treatment. Treat for >=12 months. Will follow-up in a month  Encounter for counseling regarding contraception Will discuss with provider at [facility] who will remove and reinsert her nexplanon in August. Transitioned to adult care. Emphasized routine screenings, including chlamydia, to prevent fertility issues. Discussed insurance coverage for screenings. - Schedule physical examination in one month. - Discuss chlamydia screening at next visit.  HPV vaccination due Due for HPV vaccine. Discussed importance and encouraged consideration. Advised checking vaccination records. - Check vaccination records for HPV vaccine status. - Consider HPV vaccine if not previously administered.  Encounter to establish care Welcomed to our clinic Reviewed past medical hx, social hx, family hx and surgical hx Pt advised to send all vaccination records or screening   Return in about 4 weeks (around 02/09/2024) for CPE.    The patient was advised to call back or seek an in-person evaluation if the symptoms worsen or if the condition fails to improve as anticipated.  I discussed the assessment and treatment plan with the patient. The patient was provided an opportunity to ask questions and all were answered. The patient agreed with the plan and demonstrated an understanding of the instructions.  I, Honor Frison, PA-C have reviewed all documentation for this visit. The documentation on  01/12/2024   for the exam, diagnosis, procedures, and orders are all accurate and complete.  Blane Bunting, [facility], MMS [facility] [phone] (phone) [phone] (fax)  [facility]

## 2024-01-12 NOTE — Patient Instructions (Signed)
.   Please review the attached list of medications and notify my office if there are any errors.   . Please bring all of your medications to every appointment so we can make sure that our medication list is the same as yours.   . Please go to the lab draw station in [address] on the second floor of [facility]  when you are fasting for 8 hours. Normal hours are 8:00am to 11:30am and 1:00pm to 4:00pm Monday through Friday   

## 2024-02-10 ENCOUNTER — Other Ambulatory Visit: Payer: Self-pay | Admitting: Physician Assistant

## 2024-02-10 DIAGNOSIS — F419 Anxiety disorder, unspecified: Secondary | ICD-10-CM

## 2024-03-03 ENCOUNTER — Encounter: Admitting: Physician Assistant

## 2024-03-16 ENCOUNTER — Encounter: Payer: Self-pay | Admitting: Physician Assistant

## 2024-03-16 ENCOUNTER — Ambulatory Visit (INDEPENDENT_AMBULATORY_CARE_PROVIDER_SITE_OTHER): Admitting: Physician Assistant

## 2024-03-16 VITALS — BP 101/56 | HR 80 | Resp 16 | Ht 62.0 in | Wt 93.8 lb

## 2024-03-16 DIAGNOSIS — J3089 Other allergic rhinitis: Secondary | ICD-10-CM

## 2024-03-16 DIAGNOSIS — R42 Dizziness and giddiness: Secondary | ICD-10-CM

## 2024-03-16 DIAGNOSIS — Z0001 Encounter for general adult medical examination with abnormal findings: Secondary | ICD-10-CM

## 2024-03-16 DIAGNOSIS — Z0000 Encounter for general adult medical examination without abnormal findings: Secondary | ICD-10-CM

## 2024-03-16 DIAGNOSIS — R002 Palpitations: Secondary | ICD-10-CM

## 2024-03-16 DIAGNOSIS — R739 Hyperglycemia, unspecified: Secondary | ICD-10-CM | POA: Diagnosis not present

## 2024-03-16 DIAGNOSIS — R636 Underweight: Secondary | ICD-10-CM

## 2024-03-16 DIAGNOSIS — F419 Anxiety disorder, unspecified: Secondary | ICD-10-CM

## 2024-03-16 DIAGNOSIS — R5383 Other fatigue: Secondary | ICD-10-CM

## 2024-03-16 DIAGNOSIS — Z681 Body mass index (BMI) 19 or less, adult: Secondary | ICD-10-CM

## 2024-03-16 DIAGNOSIS — F39 Unspecified mood [affective] disorder: Secondary | ICD-10-CM

## 2024-03-16 DIAGNOSIS — N921 Excessive and frequent menstruation with irregular cycle: Secondary | ICD-10-CM

## 2024-03-16 NOTE — Progress Notes (Signed)
 Complete physical exam  Patient: Alexandra Larson   DOB: 12/13/2000   23 y.o. Female  MRN: [id number] Visit Date: 03/16/2024  Today's healthcare provider: Jolynn Spencer, PA-C   Chief Complaint  Patient presents with   Annual Exam    CPE has some concerns   Subjective    Alexandra Larson is a 23 y.o. female who presents today for a complete physical exam.   Discussed the use of AI scribe software for clinical note transcription with the patient, who gave verbal consent to proceed.  History of Present Illness Alexandra Larson is a 23 year old female who presents with persistent fatigue and irregular menstrual periods.  She experiences persistent fatigue despite adequate sleep, including an episode of leaving work early due to not feeling well. She naps for extended periods and still feels tired upon waking. She is on Lexapro  10 mg without significant symptom improvement. Her menstrual periods are irregular, with heavy flows and occasional months without a period, followed by prolonged bleeding. She attributes this to Nexplanon, which she has used for three years. There is a family history of thyroid  issues, but she has not been diagnosed with thyroid  problems. She experiences dizziness, particularly when changing positions, and has felt shaky, leading to early departure from work. She denies current chest pain, shortness of breath, or respiratory symptoms. She struggles with maintaining weight, feeling she eats normally but remains underweight, with a one-pound gain since her last visit.     03/16/2024    3:27 PM 01/12/2024    2:36 PM  GAD 7 : Generalized Anxiety Score  Nervous, Anxious, on Edge 2 3  Control/stop worrying 2 3  Worry too much - different things 2 3  Trouble relaxing 2 2  Restless 1 0  Easily annoyed or irritable 3 3  Afraid - awful might happen 1 2  Total GAD 7 Score 13 16  Anxiety Difficulty Extremely difficult Very difficult       Last depression screening  scores    03/16/2024    3:27 PM 01/12/2024    2:35 PM  PHQ 2/9 Scores  PHQ - 2 Score 2 2  PHQ- 9 Score 13 6   Last fall risk screening    01/12/2024    2:35 PM  Fall Risk   Falls in the past year? 0  Number falls in past yr: 0  Injury with Fall? 0  Risk for fall due to : No Fall Risks  Follow up Falls evaluation completed   Last Audit-C alcohol use screening    01/05/2024    5:26 PM  Alcohol Use Disorder Test (AUDIT)  1. How often do you have a drink containing alcohol? 1  2. How many drinks containing alcohol do you have on a typical day when you are drinking? 1  3. How often do you have six or more drinks on one occasion? 0  AUDIT-C Score 2      Patient-reported   A score of 3 or more in women, and 4 or more in men indicates increased risk for alcohol abuse, EXCEPT if all of the points are from question 1   Past Medical History:  Diagnosis Date   Anxiety    Asthma    Depression    History reviewed. No pertinent surgical history. Social History   Socioeconomic History   Marital status: Single    Spouse name: Not on file   Number of children: Not on file  Years of education: Not on file   Highest education level: 12th grade  Occupational History   Not on file  Tobacco Use   Smoking status: Never   Smokeless tobacco: Never  Vaping Use   Vaping status: Former   Substances: Nicotine, Flavoring  Substance and Sexual Activity   Alcohol use: Not Currently    Alcohol/week: 6.0 standard drinks of alcohol    Types: 4 Shots of liquor, 2 Standard drinks or equivalent per week   Drug use: Never   Sexual activity: Yes  Other Topics Concern   Not on file  Social History Narrative   Not on file   Social Drivers of Health   Financial Resource Strain: High Risk (01/05/2024)   Overall Financial Resource Strain (CARDIA)    Difficulty of Paying Living Expenses: Hard  Food Insecurity: Food Insecurity Present (01/05/2024)   Hunger Vital Sign    Worried About Running Out  of Food in the Last Year: Often true    Ran Out of Food in the Last Year: Often true  Transportation Needs: No Transportation Needs (01/05/2024)   PRAPARE - Administrator, Civil Service (Medical): No    Lack of Transportation (Non-Medical): No  Physical Activity: Insufficiently Active (01/05/2024)   Exercise Vital Sign    Days of Exercise per Week: 7 days    Minutes of Exercise per Session: 20 min  Stress: Stress Concern Present (01/05/2024)   [employer] of Occupational Health - Occupational Stress Questionnaire    Feeling of Stress : Very much  Social Connections: Moderately Isolated (01/05/2024)   Social Connection and Isolation Panel    Frequency of Communication with Friends and Family: More than three times a week    Frequency of Social Gatherings with Friends and Family: Never    Attends Religious Services: Never    Database administrator or Organizations: No    Attends Engineer, structural: Not on file    Marital Status: Living with partner  Intimate Partner Violence: Not on file   No family status information on file.   History reviewed. No pertinent family history. No Known Allergies  Patient Care Team: Jarin Cornfield, PA-C as PCP - General (Physician Assistant)   Medications: Outpatient Medications Prior to Visit  Medication Sig   escitalopram  (LEXAPRO ) 10 MG tablet Take 1 tablet (10 mg total) by mouth daily.   etonogestrel (NEXPLANON) 68 MG IMPL implant 1 each by Subdermal route once.   ibuprofen  (ADVIL ) 600 MG tablet Take 1 tablet (600 mg total) by mouth every 8 (eight) hours as needed for moderate pain.   pantoprazole  (PROTONIX ) 40 MG tablet Take 1 tablet (40 mg total) by mouth daily. (Patient not taking: Reported on 03/16/2024)   sucralfate  (CARAFATE ) 1 g tablet Take 1 tablet (1 g total) by mouth 4 (four) times daily. (Patient not taking: Reported on 03/16/2024)   No facility-administered medications prior to visit.    Review of Systems   All other systems reviewed and are negative.  Except see HPI     Objective    BP (!) 101/56 (BP Location: Left Arm, Patient Position: Sitting, Cuff Size: Normal)   Pulse 80   Resp 16   Ht 5' 2 (1.575 m)   Wt 93 lb 12.8 oz (42.5 kg)   SpO2 100%   BMI 17.16 kg/m      Physical Exam Vitals reviewed.  Constitutional:      General: She is not in acute distress.  Appearance: Normal appearance. She is well-developed. She is not ill-appearing, toxic-appearing or diaphoretic.  HENT:     Head: Normocephalic and atraumatic.     Right Ear: Tympanic membrane, ear canal and external ear normal.     Left Ear: Tympanic membrane, ear canal and external ear normal.     Nose: Nose normal. No congestion or rhinorrhea.     Mouth/Throat:     Mouth: Mucous membranes are moist.     Pharynx: Oropharynx is clear. No oropharyngeal exudate.  Eyes:     General: No scleral icterus.       Right eye: No discharge.        Left eye: No discharge.     Conjunctiva/sclera: Conjunctivae normal.     Pupils: Pupils are equal, round, and reactive to light.  Neck:     Thyroid : No thyromegaly.     Vascular: No carotid bruit.  Cardiovascular:     Rate and Rhythm: Normal rate and regular rhythm.     Pulses: Normal pulses.     Heart sounds: Normal heart sounds. No murmur heard.    No friction rub. No gallop.  Pulmonary:     Effort: Pulmonary effort is normal. No respiratory distress.     Breath sounds: Normal breath sounds. No wheezing or rales.  Abdominal:     General: Abdomen is flat. Bowel sounds are normal. There is no distension.     Palpations: Abdomen is soft. There is no mass.     Tenderness: There is no abdominal tenderness. There is no right CVA tenderness, left CVA tenderness, guarding or rebound.     Hernia: No hernia is present.  Musculoskeletal:        General: No swelling, tenderness, deformity or signs of injury. Normal range of motion.     Cervical back: Normal range of motion and  neck supple. No rigidity or tenderness.     Right lower leg: No edema.     Left lower leg: No edema.  Lymphadenopathy:     Cervical: No cervical adenopathy.  Skin:    General: Skin is warm and dry.     Coloration: Skin is not jaundiced or pale.     Findings: No bruising, erythema, lesion or rash.  Neurological:     Mental Status: She is alert and oriented to person, place, and time. Mental status is at baseline.     Gait: Gait normal.  Psychiatric:        Mood and Affect: Mood normal.        Behavior: Behavior normal.        Thought Content: Thought content normal.        Judgment: Judgment normal.      No results found for any visits on 03/16/24.  Assessment & Plan    Routine Health Maintenance and Physical Exam  Exercise Activities and Dietary recommendations  Goals   None     Immunization History  Administered Date(s) Administered   Dtap, Unspecified 03/23/2003, 06/20/2003, 09/20/2003, 07/01/2004, 03/10/2007   HIB, Unspecified 03/23/2003, 06/20/2003, 09/20/2003, 07/01/2004   Hep A, Unspecified 03/10/2007   Hep B, Unspecified 08/14/2003, 03/23/2003, 10/26/2003   Hepatitis A, Ped/Adol-2 Dose 02/02/2008   MMR 03/04/2004, 03/10/2007   Meningococcal B, OMV 02/17/2019, 01/20/2022   Meningococcal Mcv4o 01/22/2015, 02/17/2019   Pneumococcal Conjugate PCV 7 03/23/2003, 06/20/2003, 09/20/2003, 03/04/2004   Polio, Unspecified 03/23/2003, 06/20/2003, 10/26/2003, 03/10/2007   Tdap 01/22/2015   Varicella 03/04/2004, 03/10/2007    Health Maintenance  Topic Date Due  Chlamydia screening  Never done   HPV Vaccine (1 - 3-dose series) Never done   HIV Screening  Never done   Hepatitis C Screening  Never done   Hepatitis B Vaccine (1 of 3 - 19+ 3-dose series) Never done   COVID-19 Vaccine (1 - 2024-25 season) Never done   Pap Smear  Never done   Flu Shot  04/07/2024   DTaP/Tdap/Td vaccine (7 - Td or Tdap) 01/21/2025   Meningitis B Vaccine  Completed    Discussed health  benefits of physical activity, and encouraged her to engage in regular exercise appropriate for her age and condition.  Assessment & Plan  Anxiety Mood disorder (HCC) Chronic Depression screening score of 13 suggests anxiety or depression. Lexapro  dose increase considered with caution for side effects. Psychiatry referral needed. - Increase Lexapro  dose to 1.5 tablets daily. - Refer to psychiatry for further evaluation. - Order TSH test to assess thyroid  function. - Order CBC, CMP, and lipid panel. Will follow-up  Menorrhagia Irregular, heavy menstrual periods despite Nexplanon use. Nexplanon ineffective in reducing menstrual flow. OBGYN referral needed. - Refer to OBGYN for further evaluation and possible imaging.  Allergic rhinitis Significant nasal congestion due to allergies. - Recommend nasal saline spray. - Recommend over-the-counter Flonase. - Recommend Allegra or Claritin. Will follow-up  Underweight Slightly underweight with difficulty gaining weight despite normal intake. Gained one pound since last visit. - Advise consuming small, frequent, healthy meals.  General Health Maintenance No recent eye exam. Dental infection not covered by Medicaid. - Advise scheduling an eye exam. - Advise monitoring blood pressure and reporting usual readings.  Follow-up Plans to follow up on conditions and test results. - Schedule follow-up appointment in 5 weeks. - Perform blood work before the next visit.  Hyperglycemia  Continue with low carb diet and regular exercise ordered - TSH + free T4 - CBC with Differential/Platelet - Comprehensive metabolic panel with GFR - Lipid panel Will reassess after  receiving lab results  Underweight (BMI < 18.5) Chronic, improving Body mass index is 17.16 kg/m. Advised healthy, frequent meals - CBC with Differential/Platelet Will follow-up  Allergic rhinitis due to other allergic trigger, unspecified seasonality - Avoidance measures  discussed. - Use nasal saline rinses before nose sprays such as with Neilmed Sinus Rinse bottle.  Use distilled water.   - Use Flonase 2 sprays each nostril daily. Aim upward and outward. - Use Zyrtec 10 mg daily.   Menorrhagia with irregular cycle - TSH + free T4 - CBC with Differential/Platelet - Comprehensive metabolic panel with GFR - Ambulatory referral to Gynecology  Other fatigue Dizziness Chronic, could be due to anemia, blood loss Excessive daytime sleepiness Persistent fatigue despite adequate sleep.  Experiences shakiness and dizziness, particularly when changing positions. Continue healthy diet and regular exercise - TSH + free T4 - CBC with Differential/Platelet - Comprehensive metabolic panel with GFR - Lipid panel - Ambulatory referral to Psychiatry Will follow-up  Anxiety Mood disorder (HCC)    03/16/2024    3:27 PM 01/12/2024    2:35 PM  PHQ9 SCORE ONLY  PHQ-9 Total Score 13 6      03/16/2024    3:27 PM 01/12/2024    2:36 PM  GAD 7 : Generalized Anxiety Score  Nervous, Anxious, on Edge 2 3  Control/stop worrying 2 3  Worry too much - different things 2 3  Trouble relaxing 2 2  Restless 1 0  Easily annoyed or irritable 3 3  Afraid - awful might happen  1 2  Total GAD 7 Score 13 16  Anxiety Difficulty Extremely difficult Very difficult   - TSH + free T4 - Comprehensive metabolic panel with GFR - Ambulatory referral to Psychiatry  Annual physical exam (Primary) Well adult visit with abnormal findings Things to do to keep yourself healthy  - Exercise at least 30-45 minutes a day, 3-4 days a week.  - Eat a low-fat diet with lots of fruits and vegetables, up to 7-9 servings per day.  - Seatbelts can save your life. Wear them always.  - Smoke detectors on every level of your home, check batteries every year.  - Eye Doctor - have an eye exam every 1-2 years  - Safe sex - if you may be exposed to STDs, use a condom.  - Alcohol -  If you drink, do it  moderately, less than 2 drinks per day.  - Health Care Power of Attorney. Choose someone to speak for you if you are not able.  - Depression is common in our stressful world.If you're feeling down or losing interest in things you normally enjoy, please come in for a visit.  - Violence - If anyone is threatening or hurting you, please call immediately.  - TSH + free T4 - CBC with Differential/Platelet - Comprehensive metabolic panel with GFR Will repeat in a year for CPE  No follow-ups on file.    The patient was advised to call back or seek an in-person evaluation if the symptoms worsen or if the condition fails to improve as anticipated.  I discussed the assessment and treatment plan with the patient. The patient was provided an opportunity to ask questions and all were answered. The patient agreed with the plan and demonstrated an understanding of the instructions.  I, Maicy Filip, PA-C have reviewed all documentation for this visit. The documentation on 03/16/2024  for the exam, diagnosis, procedures, and orders are all accurate and complete.  Jolynn Spencer, [facility], MMS [facility] [phone] (phone) [phone] (fax)  [facility]

## 2024-03-17 ENCOUNTER — Encounter: Payer: Self-pay | Admitting: Physician Assistant

## 2024-03-18 DIAGNOSIS — R5383 Other fatigue: Secondary | ICD-10-CM | POA: Insufficient documentation

## 2024-03-18 DIAGNOSIS — F39 Unspecified mood [affective] disorder: Secondary | ICD-10-CM | POA: Insufficient documentation

## 2024-03-18 DIAGNOSIS — R739 Hyperglycemia, unspecified: Secondary | ICD-10-CM | POA: Insufficient documentation

## 2024-03-18 DIAGNOSIS — N921 Excessive and frequent menstruation with irregular cycle: Secondary | ICD-10-CM | POA: Insufficient documentation

## 2024-03-18 DIAGNOSIS — Z0000 Encounter for general adult medical examination without abnormal findings: Secondary | ICD-10-CM | POA: Insufficient documentation

## 2024-03-18 DIAGNOSIS — J309 Allergic rhinitis, unspecified: Secondary | ICD-10-CM | POA: Insufficient documentation

## 2024-03-18 DIAGNOSIS — R42 Dizziness and giddiness: Secondary | ICD-10-CM | POA: Insufficient documentation

## 2024-03-18 DIAGNOSIS — R636 Underweight: Secondary | ICD-10-CM | POA: Insufficient documentation

## 2024-03-24 NOTE — Telephone Encounter (Signed)
Please see the message below and advise.

## 2024-03-27 ENCOUNTER — Other Ambulatory Visit: Payer: Self-pay | Admitting: Physician Assistant

## 2024-03-27 DIAGNOSIS — F419 Anxiety disorder, unspecified: Secondary | ICD-10-CM

## 2024-03-27 NOTE — Telephone Encounter (Signed)
 Called and LVM for patient. Per LOV note from Janna- Increase Lexapro  dose to 1.5 tablets daily. Need to clarify if pt is taking 15mg  daily or 10mg 

## 2024-03-27 NOTE — Telephone Encounter (Unsigned)
 Copied from CRM [id number]. Topic: Clinical - Medication Refill >> Mar 27, 2024  2:20 PM Montie POUR wrote: Medication: escitalopram  (LEXAPRO ) 10 MG tablet  Has the patient contacted their pharmacy? Yes (Agent: If no, request that the patient contact the pharmacy for the refill. If patient does not wish to contact the pharmacy document the reason why and proceed with request.) (Agent: If yes, when and what did the pharmacy advise?) Pharmacy needs order to refill This is the patient's preferred pharmacy:  [id number] - [place], [place] - [address] [place] [address] Phone: [phone] Fax: [phone]  Is this the correct pharmacy for this prescription? Yes If no, delete pharmacy and type the correct one.   Has the prescription been filled recently? No  Is the patient out of the medication? Yes - She has been out since 03/23/24  Has the patient been seen for an appointment in the last year OR does the patient have an upcoming appointment? Yes  Can we respond through MyChart? Yes  Agent: Please be advised that Rx refills may take up to 3 business days. We ask that you follow-up with your pharmacy.

## 2024-03-29 NOTE — Telephone Encounter (Signed)
 Refused Lexapro  10 mg because this is a duplicate request.  It was sent in on 03/28/2024 #45, [facility] to [facility].

## 2024-04-26 NOTE — Progress Notes (Deleted)
 Larson, Janna, PA-C   No chief complaint on file.   HPI:      Alexandra Larson is a 23 y.o. No obstetric history on file. whose LMP was No LMP recorded. Patient has had an implant., presents today for NP eval referred by PCP No labs done    Patient Active Problem List   Diagnosis Date Noted   Mood disorder (HCC) 03/18/2024   Annual physical exam 03/18/2024   Other fatigue 03/18/2024   Menorrhagia with irregular cycle 03/18/2024   Dizziness 03/18/2024   Allergic rhinitis 03/18/2024   Underweight (BMI < 18.5) 03/18/2024   Hyperglycemia 03/18/2024   Anxiety 01/12/2024   Encounter for counseling regarding contraception 01/12/2024    No past surgical history on file.  No family history on file.  Social History   Socioeconomic History   Marital status: Single    Spouse name: Not on file   Number of children: Not on file   Years of education: Not on file   Highest education level: 12th grade  Occupational History   Not on file  Tobacco Use   Smoking status: Never   Smokeless tobacco: Never  Vaping Use   Vaping status: Former   Substances: Nicotine, Flavoring  Substance and Sexual Activity   Alcohol use: Not Currently    Alcohol/week: 6.0 standard drinks of alcohol    Types: 4 Shots of liquor, 2 Standard drinks or equivalent per week   Drug use: Never   Sexual activity: Yes  Other Topics Concern   Not on file  Social History Narrative   Not on file   Social Drivers of Health   Financial Resource Strain: High Risk (01/05/2024)   Overall Financial Resource Strain (CARDIA)    Difficulty of Paying Living Expenses: Hard  Food Insecurity: Food Insecurity Present (01/05/2024)   Hunger Vital Sign    Worried About Running Out of Food in the Last Year: Often true    Ran Out of Food in the Last Year: Often true  Transportation Needs: No Transportation Needs (01/05/2024)   PRAPARE - Administrator, Civil Service (Medical): No    Lack of  Transportation (Non-Medical): No  Physical Activity: Insufficiently Active (01/05/2024)   Exercise Vital Sign    Days of Exercise per Week: 7 days    Minutes of Exercise per Session: 20 min  Stress: Stress Concern Present (01/05/2024)   [employer] of Occupational Health - Occupational Stress Questionnaire    Feeling of Stress : Very much  Social Connections: Moderately Isolated (01/05/2024)   Social Connection and Isolation Panel    Frequency of Communication with Friends and Family: More than three times a week    Frequency of Social Gatherings with Friends and Family: Never    Attends Religious Services: Never    Database administrator or Organizations: No    Attends Engineer, structural: Not on file    Marital Status: Living with partner  Intimate Partner Violence: Not on file    Outpatient Medications Prior to Visit  Medication Sig Dispense Refill   escitalopram  (LEXAPRO ) 10 MG tablet TAKE 1 TABLET BY MOUTH EVERY DAY 45 tablet 1   etonogestrel (NEXPLANON) 68 MG IMPL implant 1 each by Subdermal route once.     ibuprofen  (ADVIL ) 600 MG tablet Take 1 tablet (600 mg total) by mouth every 8 (eight) hours as needed for moderate pain. 20 tablet 0   pantoprazole  (PROTONIX ) 40 MG tablet Take 1  tablet (40 mg total) by mouth daily. (Patient not taking: Reported on 03/16/2024) 30 tablet 1   sucralfate  (CARAFATE ) 1 g tablet Take 1 tablet (1 g total) by mouth 4 (four) times daily. (Patient not taking: Reported on 03/16/2024) 120 tablet 1   No facility-administered medications prior to visit.      ROS:  Review of Systems BREAST: No symptoms   OBJECTIVE:   Vitals:  There were no vitals taken for this visit.  Physical Exam  Results: No results found for this or any previous visit (from the past 24 hours).   Assessment/Plan: No diagnosis found.    No orders of the defined types were placed in this encounter.     No follow-ups on file.  Mahlik Lenn B. Elcie Pelster,  PA-C 04/26/2024 5:23 PM

## 2024-04-27 ENCOUNTER — Encounter: Payer: Self-pay | Admitting: Obstetrics and Gynecology

## 2024-04-27 ENCOUNTER — Encounter: Payer: Self-pay | Admitting: Physician Assistant

## 2024-04-27 ENCOUNTER — Encounter: Admitting: Obstetrics and Gynecology

## 2024-04-27 DIAGNOSIS — Z113 Encounter for screening for infections with a predominantly sexual mode of transmission: Secondary | ICD-10-CM

## 2024-04-27 DIAGNOSIS — Z124 Encounter for screening for malignant neoplasm of cervix: Secondary | ICD-10-CM

## 2024-04-29 LAB — CBC WITH DIFFERENTIAL/PLATELET
Basophils Absolute: 0 x10E3/uL (ref 0.0–0.2)
Basos: 0 %
EOS (ABSOLUTE): 0.1 x10E3/uL (ref 0.0–0.4)
Eos: 1 %
Hematocrit: 39.6 % (ref 34.0–46.6)
Hemoglobin: 13.2 g/dL (ref 11.1–15.9)
Immature Grans (Abs): 0 x10E3/uL (ref 0.0–0.1)
Immature Granulocytes: 0 %
Lymphocytes Absolute: 2.7 x10E3/uL (ref 0.7–3.1)
Lymphs: 34 %
MCH: 29 pg (ref 26.6–33.0)
MCHC: 33.3 g/dL (ref 31.5–35.7)
MCV: 87 fL (ref 79–97)
Monocytes Absolute: 0.6 x10E3/uL (ref 0.1–0.9)
Monocytes: 8 %
Neutrophils Absolute: 4.4 x10E3/uL (ref 1.4–7.0)
Neutrophils: 57 %
Platelets: 214 x10E3/uL (ref 150–450)
RBC: 4.55 x10E6/uL (ref 3.77–5.28)
RDW: 11.9 % (ref 11.7–15.4)
WBC: 7.8 x10E3/uL (ref 3.4–10.8)

## 2024-04-29 LAB — COMPREHENSIVE METABOLIC PANEL WITH GFR
ALT: 37 IU/L — ABNORMAL HIGH (ref 0–32)
AST: 30 IU/L (ref 0–40)
Albumin: 4.5 g/dL (ref 4.0–5.0)
Alkaline Phosphatase: 49 IU/L (ref 44–121)
BUN/Creatinine Ratio: 17 (ref 9–23)
BUN: 11 mg/dL (ref 6–20)
Bilirubin Total: 0.4 mg/dL (ref 0.0–1.2)
CO2: 22 mmol/L (ref 20–29)
Calcium: 9.5 mg/dL (ref 8.7–10.2)
Chloride: 102 mmol/L (ref 96–106)
Creatinine, Ser: 0.64 mg/dL (ref 0.57–1.00)
Globulin, Total: 2.5 g/dL (ref 1.5–4.5)
Glucose: 90 mg/dL (ref 70–99)
Potassium: 4 mmol/L (ref 3.5–5.2)
Sodium: 137 mmol/L (ref 134–144)
Total Protein: 7 g/dL (ref 6.0–8.5)
eGFR: 129 mL/min/1.73 (ref >59)

## 2024-04-29 LAB — LIPID PANEL
Chol/HDL Ratio: 2.8 ratio (ref 0.0–4.4)
Cholesterol, Total: 167 mg/dL (ref 100–199)
HDL: 59 mg/dL (ref >39)
LDL Chol Calc (NIH): 98 mg/dL (ref 0–99)
Triglycerides: 46 mg/dL (ref 0–149)
VLDL Cholesterol Cal: 10 mg/dL (ref 5–40)

## 2024-04-29 LAB — TSH+FREE T4
Free T4: 1.58 ng/dL (ref 0.82–1.77)
TSH: 0.035 u[IU]/mL — ABNORMAL LOW (ref 0.450–4.500)

## 2024-05-01 ENCOUNTER — Ambulatory Visit (INDEPENDENT_AMBULATORY_CARE_PROVIDER_SITE_OTHER): Admitting: Physician Assistant

## 2024-05-01 ENCOUNTER — Ambulatory Visit: Payer: Self-pay | Admitting: Physician Assistant

## 2024-05-01 ENCOUNTER — Encounter: Payer: Self-pay | Admitting: Physician Assistant

## 2024-05-01 VITALS — BP 101/62 | HR 77 | Ht 62.0 in | Wt 93.9 lb

## 2024-05-01 DIAGNOSIS — J3089 Other allergic rhinitis: Secondary | ICD-10-CM | POA: Diagnosis not present

## 2024-05-01 DIAGNOSIS — F419 Anxiety disorder, unspecified: Secondary | ICD-10-CM

## 2024-05-01 DIAGNOSIS — E049 Nontoxic goiter, unspecified: Secondary | ICD-10-CM | POA: Insufficient documentation

## 2024-05-01 DIAGNOSIS — R636 Underweight: Secondary | ICD-10-CM

## 2024-05-01 DIAGNOSIS — Z681 Body mass index (BMI) 19 or less, adult: Secondary | ICD-10-CM

## 2024-05-01 DIAGNOSIS — N921 Excessive and frequent menstruation with irregular cycle: Secondary | ICD-10-CM

## 2024-05-01 DIAGNOSIS — R5383 Other fatigue: Secondary | ICD-10-CM

## 2024-05-01 DIAGNOSIS — F39 Unspecified mood [affective] disorder: Secondary | ICD-10-CM | POA: Diagnosis not present

## 2024-05-01 NOTE — Progress Notes (Signed)
 Established patient visit  Patient: Alexandra Larson   DOB: [birth year]   23 y.o. Female  MRN: [id number] Visit Date: 05/01/2024  Today's healthcare provider: Jolynn Spencer, PA-C   Chief Complaint  Patient presents with   Medical Management of Chronic Issues    Patient presents for follow up of chronic conditions. Patient reports no new concerns at this time.    Contraception    Patient states it is time for nexplanon to be replaced. Would like to discuss this    Subjective     HPI     Medical Management of Chronic Issues    Additional comments: Patient presents for follow up of chronic conditions. Patient reports no new concerns at this time.         Contraception    Additional comments: Patient states it is time for nexplanon to be replaced. Would like to discuss this       Last edited by Cherry Chiquita HERO, CMA on 05/01/2024  2:32 PM.       Discussed the use of AI scribe software for clinical note transcription with the patient, who gave verbal consent to proceed.  History of Present Illness Alexandra Larson is a 23 year old female who presents for Nexplanon removal and evaluation of heavy menstrual bleeding.  She has been using Nexplanon for three years and seeks its removal, considering replacement with another form of contraception, possibly Nexplanon again. She experiences heavy menstrual bleeding, with her current cycle lasting two weeks without signs of abating. She often feels fatigued. Her blood work shows normal hemoglobin levels despite a history of heavy menstrual periods.  She has experienced chest pain in the past, for which she was prescribed pantoprazole  and Carafate . The chest pain has improved but still occurs occasionally. She is uncertain about continuing the medication.  Her Lexapro  dosage was increased for anxiety and depression. She has not yet followed up with a psychiatrist but was referred to counseling.  Recent blood work indicates slightly elevated  liver enzymes and decreased TSH levels. She denies recent travel or changes in urine or stool color. Her family history includes hypothyroidism in her younger brother and grandmother. Her weight remains stable, and she engages in daily physical activity.       05/01/2024    2:33 PM 03/16/2024    3:27 PM 01/12/2024    2:35 PM  Depression screen PHQ 2/9  Decreased Interest 1 1 1   Down, Depressed, Hopeless 1 1 1   PHQ - 2 Score 2 2 2   Altered sleeping 2 2 1   Tired, decreased energy 2 3 1   Change in appetite 0 1 0  Feeling bad or failure about yourself  1 1 1   Trouble concentrating 1 2 1   Moving slowly or fidgety/restless 0 2 0  Suicidal thoughts 0 0 0  PHQ-9 Score 8 13 6   Difficult doing work/chores Very difficult Very difficult       05/01/2024    2:33 PM 03/16/2024    3:27 PM 01/12/2024    2:36 PM  GAD 7 : Generalized Anxiety Score  Nervous, Anxious, on Edge 2 2 3   Control/stop worrying 2 2 3   Worry too much - different things 2 2 3   Trouble relaxing 1 2 2   Restless 1 1 0  Easily annoyed or irritable 3 3 3   Afraid - awful might happen 1 1 2   Total GAD 7 Score 12 13 16   Anxiety Difficulty Very difficult Extremely difficult Very difficult  Medications: Outpatient Medications Prior to Visit  Medication Sig   escitalopram  (LEXAPRO ) 10 MG tablet TAKE 1 TABLET BY MOUTH EVERY DAY   etonogestrel (NEXPLANON) 68 MG IMPL implant 1 each by Subdermal route once.   ibuprofen  (ADVIL ) 600 MG tablet Take 1 tablet (600 mg total) by mouth every 8 (eight) hours as needed for moderate pain.   pantoprazole  (PROTONIX ) 40 MG tablet Take 1 tablet (40 mg total) by mouth daily. (Patient not taking: Reported on 05/01/2024)   sucralfate  (CARAFATE ) 1 g tablet Take 1 tablet (1 g total) by mouth 4 (four) times daily. (Patient not taking: Reported on 05/01/2024)   No facility-administered medications prior to visit.    Review of Systems  All other systems reviewed and are negative.  All negative Except  see HPI       Objective    BP 101/62 (BP Location: Right Arm, Patient Position: Sitting, Cuff Size: Normal)   Pulse 77   Ht 5' 2 (1.575 m)   Wt 93 lb 14.4 oz (42.6 kg)   LMP 04/24/2024 (Approximate)   SpO2 100%   BMI 17.17 kg/m     Physical Exam Vitals reviewed.  Constitutional:      General: She is not in acute distress.    Appearance: Normal appearance. She is well-developed. She is not diaphoretic.  HENT:     Head: Normocephalic and atraumatic.  Eyes:     General: No scleral icterus.    Conjunctiva/sclera: Conjunctivae normal.  Neck:     Thyroid : No thyromegaly.  Cardiovascular:     Rate and Rhythm: Normal rate and regular rhythm.     Pulses: Normal pulses.     Heart sounds: Normal heart sounds. No murmur heard. Pulmonary:     Effort: Pulmonary effort is normal. No respiratory distress.     Breath sounds: Normal breath sounds. No wheezing, rhonchi or rales.  Musculoskeletal:     Cervical back: Neck supple.     Right lower leg: No edema.     Left lower leg: No edema.  Lymphadenopathy:     Cervical: No cervical adenopathy.  Skin:    General: Skin is warm and dry.     Findings: No rash.  Neurological:     Mental Status: She is alert and oriented to person, place, and time. Mental status is at baseline.  Psychiatric:        Mood and Affect: Mood normal.        Behavior: Behavior normal.      No results found for any visits on 05/01/24.       Assessment & Plan Contraceptive management (Nexplanon removal and replacement) Nexplanon in place for three years. Satisfied with current method and desires removal and replacement. Discussed referral options for procedure. - Schedule appointment for Nexplanon removal and replacement with a specialist or OBGYN.  Heavy menstrual bleeding Experiencing prolonged heavy menstrual bleeding. Normal hemoglobin levels but persistent fatigue suggests possible significant blood loss. - Refer to OBGYN for evaluation of heavy  menstrual bleeding.  Anxiety and depression Chronic  Slight improvement in symptoms. Lexapro  dosage increased. Open to counseling. Counseling services covered by insurance. - Refer to in-house counseling services. - Follow up with psychiatry if medication adjustment is needed.  Subclinical hypothyroidism and thyroid  gland enlargement (goiter) Decreased TSH levels indicate subclinical hypothyroidism. Family history present. Possible thyroid  enlargement noted. Symptoms may relate to thyroid  dysfunction. Further evaluation required. - Order thyroid  ultrasound. - Monitor thyroid  hormone levels. - Consider endocrinology referral if thyroid   function tests remain abnormal. Will follow-up  Allergic rhinitis Symptoms well-managed.  - Avoidance measures discussed. - Use nasal saline rinses before nose sprays such as with Neilmed Sinus Rinse bottle.  Use distilled water.   - Use Flonase 2 sprays each nostril daily. Aim upward and outward. - Use Zyrtec 10 mg daily.   Anxiety (Primary) Mood disorder (HCC) Chronic Advised to use counseling through collaboration of care Will follow-up Collaboration of Care: Primary Care Provider AEB  , Psychiatrist AEB  , and Referral or follow-up with counselor/therapist AEB    Patient/Guardian was advised Release of Information must be obtained prior to any record release in order to collaborate their care with an outside provider. Patient/Guardian was advised if they have not already done so to contact the registration department to sign all necessary forms in order for us  to release information regarding their care.   Consent: Patient/Guardian gives verbal consent for treatment and assignment of benefits for services provided during this visit. Patient/Guardian expressed understanding and agreed to proceed.    Menorrhagia with irregular cycle Chronic history Her current menses x 2 weeks Will follow-up  Underweight (BMI < 18.5) Chronic and stable Feels  well, eats thee times a day Will follow-up  Other fatigue Chronic and stable Workup was ordered on 04/28/24 and showed normal lp, elevated ALT of 37, normal cbc, low TSH and normal T4 We will monitor and will reassess  Goiter Found on physical exam in the setting: Low TSH and normal T4 Advised to proceed with ultrasound  Anxiety (Primary) Mood disorder (HCC) - Ambulatory referral to Integrated Behavioral Health  Goiter - US  THYROID ; Future  Orders Placed This Encounter  Procedures   US  THYROID     Standing Status:   Future    Expiration Date:   05/01/2025    Reason for Exam (SYMPTOM  OR DIAGNOSIS REQUIRED):   goiter, globus sensation    Preferred imaging location?:   [facility]   Ambulatory referral to Integrated Behavioral Health    Referral Priority:   Routine    Referral Type:   Consultation    Referral Reason:   Specialty Services Required    Number of Visits Requested:   1    Return in about 6 weeks (around 06/12/2024) for chronic disease f/u.   The patient was advised to call back or seek an in-person evaluation if the symptoms worsen or if the condition fails to improve as anticipated.  I discussed the assessment and treatment plan with the patient. The patient was provided an opportunity to ask questions and all were answered. The patient agreed with the plan and demonstrated an understanding of the instructions.  I, Lenetta Piche, PA-C have reviewed all documentation for this visit. The documentation on 05/01/2024  for the exam, diagnosis, procedures, and orders are all accurate and complete.  Jolynn Spencer, [facility], MMS [facility] [phone] (phone) [phone] (fax)  [facility]

## 2024-05-03 ENCOUNTER — Encounter: Payer: Self-pay | Admitting: Physician Assistant

## 2024-05-08 ENCOUNTER — Other Ambulatory Visit: Payer: Self-pay | Admitting: Physician Assistant

## 2024-05-08 DIAGNOSIS — F419 Anxiety disorder, unspecified: Secondary | ICD-10-CM

## 2024-05-09 ENCOUNTER — Ambulatory Visit
Admission: RE | Admit: 2024-05-09 | Discharge: 2024-05-09 | Disposition: A | Discharge status: Home / Self Care | Source: Ambulatory Visit | Attending: Physician Assistant | Admitting: Physician Assistant

## 2024-05-09 DIAGNOSIS — E049 Nontoxic goiter, unspecified: Secondary | ICD-10-CM | POA: Diagnosis present

## 2024-05-11 ENCOUNTER — Ambulatory Visit: Payer: Self-pay | Admitting: Physician Assistant

## 2024-05-14 NOTE — Progress Notes (Signed)
 " Established patient visit  Patient: Alexandra Larson   DOB: 2000/12/24   23 y.o. Female  MRN: [id number] Visit Date: 05/15/2024  Today's healthcare provider: Jolynn Spencer, PA-C   Chief Complaint  Patient presents with   Follow-up    Paperwork( forms)   Subjective     HPI     Follow-up    Additional comments: Paperwork( forms)      Last edited by Marylen Odella CROME, CMA on 05/15/2024 10:02 AM.       Discussed the use of AI scribe software for clinical note transcription with the patient, who gave verbal consent to proceed.  History of Present Illness Alexandra Larson is a 23 year old female with depression and anxiety who presents for medication management and follow-up.  She has been prescribed Lexapro  for depression and anxiety but has not taken the increased dose for the last two months due to lack of perceived benefit. Counseling services have not contacted her despite a referral. She experiences fatigue, feeling tired even after adequate sleep, and dizziness, especially when changing positions. There are no palpitations or rapid heartbeats. She has been taking Lexapro  since May but does not recall the exact duration and has not noticed significant changes in her symptoms. She sleeps well but remains tired during the day and is unsure if she snores. 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       05/01/2024    2:33 PM 03/16/2024    3:27 PM 01/12/2024    2:35 PM  Depression screen PHQ 2/9  Decreased Interest 1 1 1   Down, Depressed, Hopeless 1 1 1   PHQ - 2 Score 2 2 2   Altered sleeping 2 2 1   Tired, decreased energy 2 3 1   Change in appetite 0 1 0  Feeling bad or failure about yourself  1 1 1   Trouble concentrating 1 2 1   Moving slowly or fidgety/restless 0 2 0  Suicidal thoughts 0 0 0  PHQ-9 Score 8 13 6   Difficult doing work/chores Very difficult Very difficult        05/01/2024    2:33 PM 03/16/2024    3:27 PM 01/12/2024    2:36 PM  GAD 7 : Generalized Anxiety Score  Nervous, Anxious, on Edge 2 2 3   Control/stop worrying 2 2 3   Worry too much - different things 2 2 3   Trouble relaxing 1 2 2   Restless 1 1 0  Easily annoyed or irritable 3 3 3   Afraid - awful might happen 1 1 2   Total GAD 7 Score 12 13 16   Anxiety Difficulty Very difficult Extremely difficult Very difficult    Medications: Outpatient Medications Prior to Visit  Medication Sig   escitalopram  (LEXAPRO ) 10 MG tablet TAKE 1 TABLET BY MOUTH EVERY DAY   etonogestrel (NEXPLANON) 68 MG IMPL implant 1 each by Subdermal route once.   ibuprofen  (ADVIL ) 600 MG tablet Take 1 tablet (600 mg total) by mouth every 8 (eight) hours as needed for moderate pain.   No facility-administered medications prior to visit.    Review of Systems  All other systems reviewed and are negative.  All negative Except see HPI       Objective    BP (!) 84/53 (BP Location: Right Arm, Patient Position: Sitting, Cuff Size: Small)   Pulse 66   Resp 16   Ht 5' 2 (  1.575 m)   Wt 95 lb (43.1 kg)   LMP 04/24/2024 (Approximate)   SpO2 100%   BMI 17.38 kg/m     Physical Exam Vitals reviewed.  Constitutional:      General: She is not in acute distress.    Appearance: Normal appearance. She is well-developed. She is not diaphoretic.  HENT:     Head: Normocephalic and atraumatic.  Eyes:     General: No scleral icterus.    Conjunctiva/sclera: Conjunctivae normal.  Neck:     Thyroid : No thyromegaly.  Cardiovascular:     Rate and Rhythm: Normal rate and regular rhythm.     Pulses: Normal pulses.     Heart sounds: Normal heart sounds. No murmur heard. Pulmonary:     Effort: Pulmonary effort is normal. No respiratory distress.     Breath sounds: Normal breath sounds. No wheezing, rhonchi or rales.  Musculoskeletal:     Cervical back: Neck supple.     Right lower leg: No edema.     Left lower leg: No  edema.  Lymphadenopathy:     Cervical: No cervical adenopathy.  Skin:    General: Skin is warm and dry.     Findings: No rash.  Neurological:     Mental Status: She is alert and oriented to person, place, and time. Mental status is at baseline.  Psychiatric:        Mood and Affect: Mood normal.        Behavior: Behavior normal.      No results found for any visits on 05/15/24.      Assessment & Plan Anxiety and Depression Chronic Managed with Lexapro , but no symptom improvement. Non-adherence to increased dose. Counseling referral made but not contacted. Anxiety may relate to attention issues. - Encouraged resumption of Lexapro  at 10 mg daily. - Increased Lexapro  to twice daily to assess efficacy. - Ensured follow-up with counseling services for integrated behavioral health. - Considered psychiatry referral if anxiety persists.  Fatigue Persistent fatigue despite adequate sleep, possibly related to dietary habits. - Advised dietary changes to include three regular meals focusing on breakfast and lunch. - Recommended healthy snacks such as fruits, nuts, and dried fruits. Should repeat CMP TSH at the follow-up  Hypotension Second reading showed 110/59 Low blood pressure may contribute to fatigue and dizziness. No home blood pressure cuff for monitoring. - Advised obtaining a blood pressure cuff for home monitoring. - Rechecked blood pressure in the office and performed orthostatic measurements if necessary. - Encouraged adequate hydration and dietary adjustments to manage blood pressure.  Follow-Up Follow-up necessary to monitor treatment response and ensure engagement with counseling services. - Scheduled follow-up appointment on October 6th to reassess symptoms and treatment efficacy. - Ensured communication with counseling services to confirm appointment scheduling.  Form was filled and expressed concerns regarding her mental health stability Counseling was  advised.  Menorrhagia with irregular cycle Could contribute to chronic fatigue But hemoglobin was normal Will follow-up  Goiter With low tsh and normal t4 Will recheck tsh and t4 at the follow-up   No orders of the defined types were placed in this encounter.   No follow-ups on file.   The patient was advised to call back or seek an in-person evaluation if the symptoms worsen or if the condition fails to improve as anticipated.  I discussed the assessment and treatment plan with the patient. The patient was provided an opportunity to ask questions and all were answered. The patient agreed with the  plan and demonstrated an understanding of the instructions.  I, Lanitra Battaglini, PA-C have reviewed all documentation for this visit. The documentation on 05/15/2024  for the exam, diagnosis, procedures, and orders are all accurate and complete.  Jolynn Spencer, [facility], MMS [facility] [phone] (phone) [phone] (fax)  [facility] "

## 2024-05-15 ENCOUNTER — Ambulatory Visit (INDEPENDENT_AMBULATORY_CARE_PROVIDER_SITE_OTHER): Admitting: Physician Assistant

## 2024-05-15 ENCOUNTER — Encounter: Payer: Self-pay | Admitting: Physician Assistant

## 2024-05-15 VITALS — BP 110/59 | HR 62 | Resp 16 | Ht 62.0 in | Wt 95.0 lb

## 2024-05-15 DIAGNOSIS — E049 Nontoxic goiter, unspecified: Secondary | ICD-10-CM | POA: Diagnosis not present

## 2024-05-15 DIAGNOSIS — F39 Unspecified mood [affective] disorder: Secondary | ICD-10-CM

## 2024-05-15 DIAGNOSIS — N921 Excessive and frequent menstruation with irregular cycle: Secondary | ICD-10-CM | POA: Diagnosis not present

## 2024-05-15 DIAGNOSIS — F419 Anxiety disorder, unspecified: Secondary | ICD-10-CM

## 2024-05-15 DIAGNOSIS — I9589 Other hypotension: Secondary | ICD-10-CM

## 2024-05-25 ENCOUNTER — Emergency Department

## 2024-05-25 ENCOUNTER — Other Ambulatory Visit: Payer: Self-pay

## 2024-05-25 ENCOUNTER — Encounter: Payer: Self-pay | Admitting: Emergency Medicine

## 2024-05-25 ENCOUNTER — Emergency Department
Admission: EM | Admit: 2024-05-25 | Discharge: 2024-05-26 | Disposition: A | Discharge status: Home / Self Care | Attending: Emergency Medicine | Admitting: Emergency Medicine

## 2024-05-25 DIAGNOSIS — R079 Chest pain, unspecified: Secondary | ICD-10-CM

## 2024-05-25 DIAGNOSIS — R0789 Other chest pain: Secondary | ICD-10-CM | POA: Diagnosis present

## 2024-05-25 DIAGNOSIS — R202 Paresthesia of skin: Secondary | ICD-10-CM | POA: Insufficient documentation

## 2024-05-25 DIAGNOSIS — R000 Tachycardia, unspecified: Secondary | ICD-10-CM | POA: Insufficient documentation

## 2024-05-25 DIAGNOSIS — J45909 Unspecified asthma, uncomplicated: Secondary | ICD-10-CM | POA: Diagnosis not present

## 2024-05-25 DIAGNOSIS — R0602 Shortness of breath: Secondary | ICD-10-CM | POA: Insufficient documentation

## 2024-05-25 DIAGNOSIS — R200 Anesthesia of skin: Secondary | ICD-10-CM | POA: Diagnosis not present

## 2024-05-25 LAB — BASIC METABOLIC PANEL WITH GFR
Anion gap: 12 (ref 5–15)
BUN: 11 mg/dL (ref 6–20)
CO2: 22 mmol/L (ref 22–32)
Calcium: 9.3 mg/dL (ref 8.9–10.3)
Chloride: 103 mmol/L (ref 98–111)
Creatinine, Ser: 0.78 mg/dL (ref 0.44–1.00)
GFR, Estimated: >60 mL/min (ref >60)
Glucose, Bld: 167 mg/dL — ABNORMAL HIGH (ref 70–99)
Potassium: 3.5 mmol/L (ref 3.5–5.1)
Sodium: 137 mmol/L (ref 135–145)

## 2024-05-25 LAB — POC URINE PREG, ED
Preg Test, Ur: NEGATIVE
Preg Test, Ur: NEGATIVE

## 2024-05-25 LAB — CBC
HCT: 42 % (ref 36.0–46.0)
Hemoglobin: 14.2 g/dL (ref 12.0–15.0)
MCH: 28.5 pg (ref 26.0–34.0)
MCHC: 33.8 g/dL (ref 30.0–36.0)
MCV: 84.2 fL (ref 80.0–100.0)
Platelets: 238 K/uL (ref 150–400)
RBC: 4.99 MIL/uL (ref 3.87–5.11)
RDW: 12 % (ref 11.5–15.5)
WBC: 9.3 K/uL (ref 4.0–10.5)
nRBC: 0 % (ref 0.0–0.2)

## 2024-05-25 LAB — TROPONIN I (HIGH SENSITIVITY): Troponin I (High Sensitivity): <2 ng/L (ref <18)

## 2024-05-25 MED ORDER — SODIUM CHLORIDE 0.9 % IV BOLUS (SEPSIS)
1000.0000 mL | Freq: Once | INTRAVENOUS | Status: AC
  Administered 2024-05-26: 1000 mL via INTRAVENOUS

## 2024-05-25 MED ORDER — KETOROLAC TROMETHAMINE 30 MG/ML IJ SOLN
30.0000 mg | Freq: Once | INTRAMUSCULAR | Status: AC
  Administered 2024-05-26: 30 mg via INTRAVENOUS
  Filled 2024-05-25: qty 1

## 2024-05-25 MED ORDER — GADOBUTROL 1 MMOL/ML IV SOLN
4.0000 mL | Freq: Once | INTRAVENOUS | Status: AC | PRN
  Administered 2024-05-25: 4 mL via INTRAVENOUS

## 2024-05-25 NOTE — ED Notes (Signed)
Urine sample collected and sent to lab at this time.

## 2024-05-25 NOTE — ED Triage Notes (Signed)
 Pt ambulatory to triage, gait steady, and reports being at work when she starting experiencing sharp pain under left breast, sob, and and left arm numbness/ tingling that started around 1930. Pt reports she is now experiencing numbness in roof of mouth. No facial droop or impaired speech noted. Denies pmh.

## 2024-05-25 NOTE — ED Provider Notes (Signed)
 [facility] Provider Note    Event Date/Time   First MD Initiated Contact with Patient 05/25/24 2302     (approximate)   History   Chest Pain and Numbness   HPI  Alexandra Larson is a 23 y.o. female with history of asthma, anxiety, depression, headaches who presents to the emergency department with complaints of left-sided sharp chest pain that started around 7 PM while at work with shortness of breath, heart racing.  States then she felt like her left arm was numb, tingling and her mouth felt numb.  Still having the numbness in her mouth, left arm and some chest pain.  No history of PE or DVT.  No calf tenderness or calf swelling.  No headache currently.  No head injury, no neck or back pain.  No bowel or bladder incontinence.  No family history of multiple sclerosis or other neurologic conditions.  Has never had similar symptoms.   History provided by patient, significant other.    Past Medical History:  Diagnosis Date   Anxiety    Asthma    Depression     History reviewed. No pertinent surgical history.  MEDICATIONS:  Prior to Admission medications   Medication Sig Start Date End Date Taking? Authorizing Provider  escitalopram  (LEXAPRO ) 10 MG tablet TAKE 1 TABLET BY MOUTH EVERY DAY 05/09/24   Ostwalt, Janna, PA-C  etonogestrel (NEXPLANON) 68 MG IMPL implant 1 each by Subdermal route once.    [provider]  ibuprofen  (ADVIL ) 600 MG tablet Take 1 tablet (600 mg total) by mouth every 8 (eight) hours as needed for moderate pain. 08/07/21   Woods, Jaclyn M, PA-C    Physical Exam   Triage Vital Signs: ED Triage Vitals  Encounter Vitals Group     BP 05/25/24 2031 124/82     Girls Systolic BP Percentile --      Girls Diastolic BP Percentile --      Boys Systolic BP Percentile --      Boys Diastolic BP Percentile --      Pulse Rate 05/25/24 2031 (!) 104     Resp 05/25/24 2031 18     Temp 05/25/24 2031 98.5 F (36.9 C)     Temp Source  05/25/24 2031 Oral     SpO2 05/25/24 2030 100 %     Weight 05/25/24 2031 91 lb (41.3 kg)     Height 05/25/24 2031 5' 2 (1.575 m)     Head Circumference --      Peak Flow --      Pain Score 05/25/24 2031 8     Pain Loc --      Pain Education --      Exclude from Growth Chart --     Most recent vital signs: Vitals:   05/26/24 0113 05/26/24 0114  BP:  102/70  Pulse:  (!) 57  Resp:  12  Temp: 98.1 F (36.7 C) 98.1 F (36.7 C)  SpO2:  100%    CONSTITUTIONAL: Alert, responds appropriately to questions. Well-appearing; well-nourished HEAD: Normocephalic, atraumatic EYES: Conjunctivae clear, pupils appear equal, sclera nonicteric ENT: normal nose; moist mucous membranes NECK: Supple, normal ROM CARD: Regular and tachycardic; S1 and S2 appreciated RESP: Normal chest excursion without splinting or tachypnea; breath sounds clear and equal bilaterally; no wheezes, no rhonchi, no rales, no hypoxia or respiratory distress, speaking full sentences ABD/GI: Non-distended; soft, non-tender, no rebound, no guarding, no peritoneal signs BACK: The back appears normal EXT: Normal  ROM in all joints; no deformity noted, no edema, no calf tenderness or calf swelling SKIN: Normal color for age and race; warm; no rash on exposed skin NEURO: Moves all extremities equally, normal speech, decreased sensation throughout the left arm compared to the right but otherwise normal sensation in the face and legs, diminished grip strength in the left hand, cranial nerves II through XII intact, normal speech PSYCH: The patient's mood and manner are appropriate.   ED Results / Procedures / Treatments   LABS: (all labs ordered are listed, but only abnormal results are displayed) Labs Reviewed  BASIC METABOLIC PANEL WITH GFR - Abnormal; Notable for the following components:      Result Value   Glucose, Bld 167 (*)    All other components within normal limits  CBC  D-DIMER, QUANTITATIVE  T4, FREE  MAGNESIUM   TSH  POC URINE PREG, ED  POC URINE PREG, ED  TROPONIN I (HIGH SENSITIVITY)  TROPONIN I (HIGH SENSITIVITY)     EKG:  EKG Interpretation Date/Time:  Thursday May 25 2024 20:30:48 EDT Ventricular Rate:  98 PR Interval:  132 QRS Duration:  84 QT Interval:  310 QTC Calculation: 395 R Axis:   89  Text Interpretation: Normal sinus rhythm with sinus arrhythmia T wave abnormality, consider inferolateral ischemia Abnormal ECG When compared with ECG of 19-Apr-2023 23:10, Vent. rate has increased BY  38 BPM ST no longer elevated in Inferior leads T wave inversion now evident in Inferior leads T wave inversion now evident in Lateral leads Confirmed by Neomi Neptune [phone]) on 05/25/2024 11:12:24 PM         RADIOLOGY: My personal review and interpretation of imaging: Chest x-ray clear.  MRI brain and,.  I have personally reviewed all radiology reports.   MR Brain W and Wo Contrast Result Date: 05/26/2024 CLINICAL DATA:  left arm numbness EXAM: MRI HEAD WITHOUT AND WITH CONTRAST TECHNIQUE: Multiplanar, multiecho pulse sequences of the brain and surrounding structures were obtained without and with intravenous contrast. CONTRAST:  4mL GADAVIST  GADOBUTROL  1 MMOL/ML IV SOLN COMPARISON:  CT head 05/11/2022. FINDINGS: Brain: No acute infarction, hemorrhage, hydrocephalus, extra-axial collection or mass lesion. No abnormal enhancement. Vascular: Normal flow voids. Skull and upper cervical spine: Normal marrow signal. Sinuses/Orbits: Negative. IMPRESSION: Normal brain MRI. No acute abnormality. Electronically Signed   By: Gilmore GORMAN Molt M.D.   On: 05/26/2024 00:15   DG Chest 2 View Result Date: 05/25/2024 CLINICAL DATA:  Chest pain on the left, initial encounter EXAM: CHEST - 2 VIEW COMPARISON:  04/19/2023 FINDINGS: The heart size and mediastinal contours are within normal limits. Both lungs are clear. The visualized skeletal structures show mild scoliosis concave to the left. IMPRESSION: No  active cardiopulmonary disease. Electronically Signed   By: Oneil Devonshire M.D.   On: 05/25/2024 21:02     PROCEDURES:  Critical Care performed: No    .1-3 Lead EKG Interpretation  Performed by: Delvecchio Madole, Neptune SAILOR, DO Authorized by: Illeana Edick, Neptune SAILOR, DO     Interpretation: abnormal     ECG rate:  104   ECG rate assessment: tachycardic     Rhythm: sinus tachycardia     Ectopy: none     Conduction: normal       IMPRESSION / MDM / ASSESSMENT AND PLAN / ED COURSE  I reviewed the triage vital signs and the nursing notes.    Patient here with chest pain, mouth and left arm numbness.  The patient is on the cardiac monitor  to evaluate for evidence of arrhythmia and/or significant heart rate changes.   DIFFERENTIAL DIAGNOSIS (includes but not limited to):   PE, anemia, electrolyte derangement, thyroid  dysfunction, less likely ACS or dissection.  Differential also includes complicated migraine, multiple sclerosis, less likely stroke.  Doubt intracranial hemorrhage.   Patient's presentation is most consistent with acute presentation with potential threat to life or bodily function.   PLAN: Patient's hemoglobin is normal.  Normal potassium, glucose.  First troponin negative.  Second pending.  Pregnancy test negative.  EKG shows nonspecific T wave changes which could be rate related.  Will repeat EKG.  Will obtain thyroid  function studies, magnesium level, D-dimer.  Chest x-ray reviewed and interpreted by myself and the radiologist and is clear.  Given the left-sided numbness in her arm and in her mouth, I am concerned for possible intracranial abnormalities including mass, multiple sclerosis or other demyelinating process, less likely stroke.  CVT also on the differential as she is on birth control but she is not having any headache currently.  Will obtain MRI of the brain with and without contrast.  Will give Toradol , IV fluids for residual chest pain.   MEDICATIONS GIVEN IN  ED: Medications  ketorolac  (TORADOL ) 30 MG/ML injection 30 mg (30 mg Intravenous Given 05/26/24 0108)  sodium chloride  0.9 % bolus 1,000 mL (0 mLs Intravenous Stopped 05/26/24 0227)  gadobutrol  (GADAVIST ) 1 MMOL/ML injection 4 mL (4 mLs Intravenous Contrast Given 05/25/24 2356)     ED COURSE: Troponin x 2 negative.  D-dimer negative which makes my suspicion for PE, dissection, cavernous sinus thrombosis low.  Magnesium, thyroid  function studies normal.  MRI of the brain reviewed and interpreted by myself and the radiologist and shows no acute abnormality.  Patient reports feeling better.  Low suspicion for any life-threatening illness today and I feel she is safe for discharge home with close outpatient follow-up.  She is comfortable with this plan.   At this time, I do not feel there is any life-threatening condition present. I reviewed all nursing notes, vitals, pertinent previous records.  All lab and urine results, EKGs, imaging ordered have been independently reviewed and interpreted by myself.  I reviewed all available radiology reports from any imaging ordered this visit.  Based on my assessment, I feel the patient is safe to be discharged home without further emergent workup and can continue workup as an outpatient as needed. Discussed all findings, treatment plan as well as usual and customary return precautions.  They verbalize understanding and are comfortable with this plan.  Outpatient follow-up has been provided as needed.  All questions have been answered.    CONSULTS:  none   OUTSIDE RECORDS REVIEWED: Reviewed last family medicine notes.       FINAL CLINICAL IMPRESSION(S) / ED DIAGNOSES   Final diagnoses:  Chest pain, unspecified type  Paresthesia     Rx / DC Orders   ED Discharge Orders     None        Note:  This document was prepared using Dragon voice recognition software and may include unintentional dictation errors.   Chord Takahashi, Josette SAILOR, DO 05/26/24  [phone]

## 2024-05-25 NOTE — ED Notes (Signed)
 Blue, green, and lav top tubes collected and sent to lab at this time

## 2024-05-26 LAB — MAGNESIUM: Magnesium: 2.3 mg/dL (ref 1.7–2.4)

## 2024-05-26 LAB — TSH: TSH: 4.23 u[IU]/mL (ref 0.350–4.500)

## 2024-05-26 LAB — TROPONIN I (HIGH SENSITIVITY): Troponin I (High Sensitivity): <2 ng/L (ref <18)

## 2024-05-26 LAB — T4, FREE: Free T4: 0.82 ng/dL (ref 0.61–1.12)

## 2024-05-26 LAB — D-DIMER, QUANTITATIVE: D-Dimer, Quant: <0.27 ug{FEU}/mL (ref 0.00–0.50)

## 2024-05-26 NOTE — Discharge Instructions (Addendum)
 You may alternate over the counter Tylenol 1000 mg every 6 hours as needed for pain, fever and Ibuprofen 800 mg every 6-8 hours as needed for pain, fever.  Please take Ibuprofen with food.  Do not take more than 4000 mg of Tylenol (acetaminophen) in a 24 hour period.

## 2024-06-12 ENCOUNTER — Ambulatory Visit: Admitting: Physician Assistant

## 2024-07-03 ENCOUNTER — Ambulatory Visit (INDEPENDENT_AMBULATORY_CARE_PROVIDER_SITE_OTHER): Admitting: Physician Assistant

## 2024-07-03 ENCOUNTER — Encounter: Payer: Self-pay | Admitting: Physician Assistant

## 2024-07-03 VITALS — BP 112/76 | HR 94 | Temp 98.1°F | Resp 14 | Ht 62.0 in | Wt 95.6 lb

## 2024-07-03 DIAGNOSIS — J0100 Acute maxillary sinusitis, unspecified: Secondary | ICD-10-CM

## 2024-07-03 DIAGNOSIS — R062 Wheezing: Secondary | ICD-10-CM | POA: Diagnosis not present

## 2024-07-03 MED ORDER — AZITHROMYCIN 250 MG PO TABS: ORAL_TABLET | ORAL | 0 refills | Status: AC

## 2024-07-03 MED ORDER — ALBUTEROL SULFATE HFA 108 (90 BASE) MCG/ACT IN AERS: 2.0000 | INHALATION_SPRAY | Freq: Four times a day (QID) | RESPIRATORY_TRACT | 0 refills | Status: AC | PRN

## 2024-07-03 NOTE — Progress Notes (Signed)
 Established patient visit  Patient: Alexandra Larson   DOB: 25-Aug-2001   23 y.o. Female  MRN: [id number] Visit Date: 07/03/2024  Today's healthcare provider: Jolynn Spencer, PA-C   Chief Complaint  Patient presents with   Sinus Problem    Ongoing 3 weeks started with a cough, sinus/facial pressure, nasal drainage. Otc: dayquil, nyquil. Within last week has had bilateral eye swelling and watery eye only towards night time   Subjective     HPI     Sinus Problem    Additional comments: Ongoing 3 weeks started with a cough, sinus/facial pressure, nasal drainage. Otc: dayquil, nyquil. Within last week has had bilateral eye swelling and watery eye only towards night time      Last edited by Wilfred Hargis RAMAN, CMA on 07/03/2024  2:02 PM.       Discussed the use of AI scribe software for clinical note transcription with the patient, who gave verbal consent to proceed.  History of Present Illness Alexandra Larson is a 23 year old female who presents with a three-week history of cough, sinus pressure, and nasal drainage.  She initially experienced a severe cough that improved after two weeks but then worsened, accompanied by increased sinus pressure and congestion. She has been using DayQuil and NyQuil for symptom management. Eye swelling and watery eyes occur primarily at night, with swelling under the eyes and redness. She is unsure about having a fever but feels warm. She denies current smoking or vaping but vaped nicotine three years ago. She occasionally experiences acid reflux or indigestion. 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       05/01/2024    2:33 PM 03/16/2024    3:27 PM 01/12/2024    2:35 PM  Depression screen PHQ 2/9  Decreased Interest 1 1 1   Down, Depressed, Hopeless 1 1 1   PHQ - 2 Score 2 2 2   Altered sleeping 2 2 1   Tired, decreased energy 2 3 1   Change in appetite 0 1 0  Feeling bad or failure about yourself  1 1 1   Trouble  concentrating 1 2 1   Moving slowly or fidgety/restless 0 2 0  Suicidal thoughts 0 0 0  PHQ-9 Score 8 13 6   Difficult doing work/chores Very difficult Very difficult       05/01/2024    2:33 PM 03/16/2024    3:27 PM 01/12/2024    2:36 PM  GAD 7 : Generalized Anxiety Score  Nervous, Anxious, on Edge 2 2 3   Control/stop worrying 2 2 3   Worry too much - different things 2 2 3   Trouble relaxing 1 2 2   Restless 1 1 0  Easily annoyed or irritable 3 3 3   Afraid - awful might happen 1 1 2   Total GAD 7 Score 12 13 16   Anxiety Difficulty Very difficult Extremely difficult Very difficult    Medications: Outpatient Medications Prior to Visit  Medication Sig   escitalopram  (LEXAPRO ) 10 MG tablet TAKE 1 TABLET BY MOUTH EVERY DAY   etonogestrel (NEXPLANON) 68 MG IMPL implant 1 each by Subdermal route once.   ibuprofen  (ADVIL ) 600 MG tablet Take 1 tablet (600 mg total) by mouth every 8 (eight) hours as needed for moderate pain.   No facility-administered medications prior to visit.    Review of Systems All negative Except see HPI       Objective    BP 112/76   Pulse 94   Temp  98.1 F (36.7 C) (Oral)   Resp 14   Ht 5' 2 (1.575 m)   Wt 95 lb 9.6 oz (43.4 kg)   SpO2 100%   BMI 17.49 kg/m     Physical Exam Vitals reviewed.  Constitutional:      Appearance: She is normal weight.  HENT:     Head: Normocephalic and atraumatic.     Right Ear: Ear canal and external ear normal.     Left Ear: Ear canal and external ear normal.     Nose: Congestion and rhinorrhea present.     Mouth/Throat:     Pharynx: Posterior oropharyngeal erythema present.     Comments: Postnasal drainage noted Eyes:     General: No scleral icterus.       Right eye: No discharge.        Left eye: No discharge.     Extraocular Movements: Extraocular movements intact.     Pupils: Pupils are equal, round, and reactive to light.  Cardiovascular:     Rate and Rhythm: Normal rate and regular rhythm.   Pulmonary:     Effort: Pulmonary effort is normal.     Breath sounds: Normal breath sounds.  Abdominal:     General: Abdomen is flat. Bowel sounds are normal.     Palpations: Abdomen is soft.  Lymphadenopathy:     Cervical: No cervical adenopathy.  Neurological:     Mental Status: She is alert.      No results found for any visits on 07/03/24.      Assessment & Plan Subacute Sinusitis X 3 weeks Symptoms consistent with subacute sinusitis, possibly exacerbated by post-nasal drip and potential acid reflux. Antibiotics prescribed for potential bacterial infection. Works with children - Prescribe antibiotics for 5 days. - Recommend warm salt gargles for sore throat relief. - Advise drinking plenty of fluids, including warm tea with honey. - Suggest using nasal saline spray or Flonase for congestion. - Recommend over-the-counter antihistamines such as Allegra, Claritin, or Zyrtec. - Prescribe albuterol inhaler for wheezing. - Instruct to follow up if symptoms persist by the end of the week.  Wheezing Albuterol inhaler suggested. - Prescribe albuterol inhaler for cough , sob and wheezing. - Instruct to monitor symptoms and report if wheezing persists or worsens.  General Health Maintenance Advised to wear a mask to prevent recurrent infections due to work exposure. - Advise wearing a mask at work. Subacute maxillary sinusitis (Primary)  - albuterol (VENTOLIN HFA) 108 (90 Base) MCG/ACT inhaler; Inhale 2 puffs into the lungs every 6 (six) hours as needed for wheezing or shortness of breath.  Dispense: 8 g; Refill: 0 - azithromycin (ZITHROMAX) 250 MG tablet; Take 2 tablets on day 1, then 1 tablet daily on days 2 through 5  Dispense: 6 tablet; Refill: 0  No orders of the defined types were placed in this encounter.   No follow-ups on file.   The patient was advised to call back or seek an in-person evaluation if the symptoms worsen or if the condition fails to improve as  anticipated.  I discussed the assessment and treatment plan with the patient. The patient was provided an opportunity to ask questions and all were answered. The patient agreed with the plan and demonstrated an understanding of the instructions.  I, Njeri Vicente, PA-C have reviewed all documentation for this visit. The documentation on 07/03/2024  for the exam, diagnosis, procedures, and orders are all accurate and complete.  Mirl Hillery, PAC, MMS [facility]  [phone] (phone) [phone] (fax)  [facility]
